# Patient Record
Sex: Male | Born: 1960
Health system: Southern US, Community
[De-identification: ages and names within clinical notes are randomized; demographics above are authoritative.]

---

## 2001-06-16 ENCOUNTER — Inpatient Hospital Stay (HOSPITAL_COMMUNITY): Admission: EM | Admit: 2001-06-16 | Discharge: 2001-06-17 | Payer: Self-pay | Admitting: Emergency Medicine

## 2001-06-16 ENCOUNTER — Encounter (INDEPENDENT_AMBULATORY_CARE_PROVIDER_SITE_OTHER): Payer: Self-pay | Admitting: Specialist

## 2005-12-20 ENCOUNTER — Encounter: Admission: RE | Admit: 2005-12-20 | Discharge: 2005-12-20 | Payer: Self-pay | Admitting: Internal Medicine

## 2005-12-24 ENCOUNTER — Encounter: Admission: RE | Admit: 2005-12-24 | Discharge: 2005-12-24 | Payer: Self-pay | Admitting: Internal Medicine

## 2007-04-19 ENCOUNTER — Encounter: Admission: RE | Admit: 2007-04-19 | Discharge: 2007-04-19 | Payer: Self-pay | Admitting: Family Medicine

## 2008-08-04 ENCOUNTER — Emergency Department (HOSPITAL_COMMUNITY): Admission: EM | Admit: 2008-08-04 | Discharge: 2008-08-04 | Payer: Self-pay | Admitting: Emergency Medicine

## 2010-05-01 ENCOUNTER — Encounter: Admission: RE | Admit: 2010-05-01 | Discharge: 2010-05-01 | Payer: Self-pay | Admitting: Family Medicine

## 2010-10-18 ENCOUNTER — Other Ambulatory Visit: Payer: Self-pay | Admitting: Physician Assistant

## 2010-12-23 NOTE — Op Note (Signed)
[facility]  Patient:    Eric Herrera, Eric Herrera Visit Number: [id number] MRN: [id number]          Service Type: SUR Location: 4W 0481 01 Attending Physician:  Shelly Rubenstein Dictated by:   Abigail Miyamoto, M.D. Proc. Date: 06/16/01 Admit Date:  06/16/2001 Discharge Date: 06/17/2001                             Operative Report  PREOPERATIVE DIAGNOSIS:  Acute appendicitis.  POSTOPERATIVE DIAGNOSIS:  Acute appendicitis.  OPERATION/PROCEDURE:  Open appendectomy.  SURGEON:  Abigail Miyamoto, M.D.  ANESTHESIA:  General endotracheal anesthesia and 1/2% Marcaine.  ESTIMATED BLOOD LOSS:  Minimal.  PROCEDURE IN DETAIL:  The patient was brought to the operating room and identified as [employer].  He was placed supine on the operating room table and general anesthesia was induced.  His abdomen was then prepped and draped in the usual sterile fashion.  A small transverse incision was then made in the patients right lower quadrant.  The incision was carried down to the external oblique fascia which was then opened with the scalpel and then further with the Metzenbaum scissors.  The muscles were then retracted bluntly to identify the peritoneum which was then again opened with Metzenbaum scissors.  The peritoneum was then completely opened.  Upon entering the abdomen, no free fluid was identified.  The appendix was easily identified and elevated up out of the wound and found to be acutely inflamed.  The mesoappendix was taken down with Kelly clamps and 2-0 Vicryl tie.  The base of the appendix was then identified and was crushed with a hemostat. The stump was then tied off with two separate 2-0 Vicryl sutures.  The appendix was then transected.  The stump was then cauterized with electrocautery.  The appendix was removed and sent to pathology.  The abdomen was then copiously irrigated with normal saline.  The peritoneum and posterior layer was then  closed with a running 2-0 Vicryl suture.  The anterior fascia was then closed with a running #1 PDS suture.  The wound was then again irrigated.  The subcutaneous layer was then closed with interrupted 3-0 Vicryl sutures. The skin was closed with a running 4-0 Monocryl and Steri-Strips.  Gauze and tape were then applied.  The patient tolerated the procedure well.  All sponge, needle and instrument counts were correct at the end of the procedure.  The patient was then extubated in the operating room and taken stable to the recovery room. Dictated by:   Abigail Miyamoto, M.D. Attending Physician:  Shelly Rubenstein DD:  06/16/01 TD:  06/17/01 Job: [id number]

## 2010-12-23 NOTE — Op Note (Signed)
[facility]  Patient:    Eric Herrera, Eric Herrera Visit Number: [id number] MRN: [id number]          Service Type: SUR Location: 4W 0481 01 Attending Physician:  Shelly Rubenstein Dictated by:   Abigail Miyamoto, M.D. Proc. Date: 06/16/01 Admit Date:  06/16/2001 Discharge Date: 06/17/2001                             Operative Report  PREOPERATIVE DIAGNOSIS:  Acute appendicitis.  POSTOPERATIVE DIAGNOSIS:  Acute appendicitis.  PROCEDURE:  Open appendectomy.  SURGEON:  Abigail Miyamoto, M.D.  ANESTHESIA:  General endotracheal anesthesia and 0.50% Marcaine.  ESTIMATED BLOOD LOSS: Minimal.  PROCEDURE IN DETAIL: The patient was brought to the operating room and identified as Eric Herrera. He was placed supine on the operating room table, and general anesthesia was induced.  His abdomen was then prepped and draped in the usual sterile fashion.  A small transverse incision was then made in the patients right lower quadrant.  The incision was carried down to the external oblique fascia, which was then opened with the scalpel and then further with the Metzenbaum scissors.  The muscles were then retracted bluntly to identify the peritoneum, which was then opened again with Metzenbaum scissors.  The peritoneum was then completely opened.  Upon entering the abdomen, no free fluid was identified.  The appendix was easily identified and elevated up out of the wound and found to be acutely inflamed.  The mesoappendix was taken down with Kelly clamps and 2-0 Vicryl ties.  The base of the appendix was then identified and was crushed with a hemostat.  The stump was then tied off with two separate 2-0 Vicryl sutures.  The appendix was then transected.  The stump was then cauterized with electrocautery.  The appendix was removed from the field and sent to pathology.  The abdomen was then copiously irrigated with normal saline.  The peritoneum and the posterior layer were  then closed with a running 2-0 Vicryl suture.  The anterior fascia was then closed with a running #1 PDS suture.  The wound was then irrigated. The subcutaneous layer was then closed with interrupted 3-0 Vicryl suture, and the skin was closed with a running 4-0 Monocryl and Steri-Strips.  Gauze and tape were then applied.  The patient tolerated the procedure well.  All sponge, needle, and instrument counts were correct at the end of the procedure.  The patient was then extubated in the operating room and taken in a stable condition to the recovery room. Dictated by:   Abigail Miyamoto, M.D. Attending Physician:  Shelly Rubenstein DD:  06/16/01 TD:  06/17/01 Job: [id number]

## 2011-05-11 LAB — CBC
MCHC: 34 g/dL (ref 30.0–36.0)
MCV: 92.5 fL (ref 78.0–100.0)
RBC: 4.55 MIL/uL (ref 4.22–5.81)

## 2011-05-11 LAB — URINE CULTURE
Colony Count: NO GROWTH
Culture: NO GROWTH

## 2011-05-11 LAB — CULTURE, BLOOD (ROUTINE X 2): Culture: NO GROWTH

## 2011-05-11 LAB — URINALYSIS, ROUTINE W REFLEX MICROSCOPIC
Bilirubin Urine: NEGATIVE
Glucose, UA: NEGATIVE mg/dL
Ketones, ur: NEGATIVE mg/dL
Nitrite: NEGATIVE
Protein, ur: NEGATIVE mg/dL
Specific Gravity, Urine: 1.019 (ref 1.005–1.030)

## 2011-05-11 LAB — DIFFERENTIAL
Basophils Absolute: 0 10*3/uL (ref 0.0–0.1)
Monocytes Relative: 12 % (ref 3–12)
Neutro Abs: 5.9 10*3/uL (ref 1.7–7.7)

## 2011-05-11 LAB — COMPREHENSIVE METABOLIC PANEL
ALT: 49 U/L (ref 0–53)
Albumin: 4.1 g/dL (ref 3.5–5.2)
Alkaline Phosphatase: 60 U/L (ref 39–117)
Calcium: 9.3 mg/dL (ref 8.4–10.5)
GFR calc Af Amer: >60 mL/min (ref >60)
Potassium: 4.2 mEq/L (ref 3.5–5.1)

## 2011-10-10 ENCOUNTER — Other Ambulatory Visit: Payer: Self-pay | Admitting: Gastroenterology

## 2013-07-24 ENCOUNTER — Other Ambulatory Visit: Payer: Self-pay | Admitting: Physician Assistant

## 2013-07-24 ENCOUNTER — Ambulatory Visit
Admission: RE | Admit: 2013-07-24 | Discharge: 2013-07-24 | Disposition: A | Discharge status: Home / Self Care | Payer: 59 | Source: Ambulatory Visit | Attending: Physician Assistant | Admitting: Physician Assistant

## 2013-07-24 DIAGNOSIS — R52 Pain, unspecified: Secondary | ICD-10-CM

## 2014-01-30 ENCOUNTER — Other Ambulatory Visit: Payer: Self-pay | Admitting: Family Medicine

## 2014-01-30 ENCOUNTER — Ambulatory Visit
Admission: RE | Admit: 2014-01-30 | Discharge: 2014-01-30 | Disposition: A | Discharge status: Home / Self Care | Payer: 59 | Source: Ambulatory Visit | Attending: Family Medicine | Admitting: Family Medicine

## 2014-01-30 DIAGNOSIS — M79672 Pain in left foot: Secondary | ICD-10-CM

## 2014-07-07 ENCOUNTER — Ambulatory Visit
Admission: RE | Admit: 2014-07-07 | Discharge: 2014-07-07 | Disposition: A | Discharge status: Home / Self Care | Payer: 59 | Source: Ambulatory Visit | Attending: Physician Assistant | Admitting: Physician Assistant

## 2014-07-07 ENCOUNTER — Other Ambulatory Visit: Payer: Self-pay | Admitting: Physician Assistant

## 2014-07-07 DIAGNOSIS — R5381 Other malaise: Secondary | ICD-10-CM

## 2014-07-07 DIAGNOSIS — R5383 Other fatigue: Principal | ICD-10-CM

## 2014-07-07 DIAGNOSIS — R05 Cough: Secondary | ICD-10-CM

## 2014-07-07 DIAGNOSIS — R059 Cough, unspecified: Secondary | ICD-10-CM

## 2015-12-16 DIAGNOSIS — L299 Pruritus, unspecified: Secondary | ICD-10-CM | POA: Diagnosis not present

## 2015-12-18 DIAGNOSIS — H10013 Acute follicular conjunctivitis, bilateral: Secondary | ICD-10-CM | POA: Diagnosis not present

## 2016-03-31 DIAGNOSIS — E7800 Pure hypercholesterolemia, unspecified: Secondary | ICD-10-CM | POA: Diagnosis not present

## 2016-03-31 DIAGNOSIS — Z125 Encounter for screening for malignant neoplasm of prostate: Secondary | ICD-10-CM | POA: Diagnosis not present

## 2016-03-31 DIAGNOSIS — J309 Allergic rhinitis, unspecified: Secondary | ICD-10-CM | POA: Diagnosis not present

## 2016-03-31 DIAGNOSIS — I10 Essential (primary) hypertension: Secondary | ICD-10-CM | POA: Diagnosis not present

## 2016-03-31 DIAGNOSIS — Z23 Encounter for immunization: Secondary | ICD-10-CM | POA: Diagnosis not present

## 2016-03-31 DIAGNOSIS — Z0000 Encounter for general adult medical examination without abnormal findings: Secondary | ICD-10-CM | POA: Diagnosis not present

## 2016-04-13 DIAGNOSIS — J309 Allergic rhinitis, unspecified: Secondary | ICD-10-CM | POA: Diagnosis not present

## 2016-04-13 DIAGNOSIS — H6690 Otitis media, unspecified, unspecified ear: Secondary | ICD-10-CM | POA: Diagnosis not present

## 2016-04-13 DIAGNOSIS — J45909 Unspecified asthma, uncomplicated: Secondary | ICD-10-CM | POA: Diagnosis not present

## 2016-06-23 DIAGNOSIS — S00412A Abrasion of left ear, initial encounter: Secondary | ICD-10-CM | POA: Diagnosis not present

## 2016-09-14 DIAGNOSIS — H3589 Other specified retinal disorders: Secondary | ICD-10-CM | POA: Diagnosis not present

## 2016-09-14 DIAGNOSIS — H25013 Cortical age-related cataract, bilateral: Secondary | ICD-10-CM | POA: Diagnosis not present

## 2016-09-14 DIAGNOSIS — H40013 Open angle with borderline findings, low risk, bilateral: Secondary | ICD-10-CM | POA: Diagnosis not present

## 2016-09-14 DIAGNOSIS — H2513 Age-related nuclear cataract, bilateral: Secondary | ICD-10-CM | POA: Diagnosis not present

## 2016-10-04 DIAGNOSIS — J309 Allergic rhinitis, unspecified: Secondary | ICD-10-CM | POA: Diagnosis not present

## 2016-10-04 DIAGNOSIS — J45909 Unspecified asthma, uncomplicated: Secondary | ICD-10-CM | POA: Diagnosis not present

## 2016-11-17 DIAGNOSIS — H40001 Preglaucoma, unspecified, right eye: Secondary | ICD-10-CM | POA: Diagnosis not present

## 2016-11-17 DIAGNOSIS — H40002 Preglaucoma, unspecified, left eye: Secondary | ICD-10-CM | POA: Diagnosis not present

## 2016-11-17 DIAGNOSIS — H02839 Dermatochalasis of unspecified eye, unspecified eyelid: Secondary | ICD-10-CM | POA: Diagnosis not present

## 2016-11-17 DIAGNOSIS — H40003 Preglaucoma, unspecified, bilateral: Secondary | ICD-10-CM | POA: Diagnosis not present

## 2016-11-17 DIAGNOSIS — H25013 Cortical age-related cataract, bilateral: Secondary | ICD-10-CM | POA: Diagnosis not present

## 2016-11-17 DIAGNOSIS — H2513 Age-related nuclear cataract, bilateral: Secondary | ICD-10-CM | POA: Diagnosis not present

## 2017-02-05 DIAGNOSIS — R490 Dysphonia: Secondary | ICD-10-CM | POA: Diagnosis not present

## 2017-03-07 DIAGNOSIS — R05 Cough: Secondary | ICD-10-CM | POA: Diagnosis not present

## 2017-03-07 DIAGNOSIS — R490 Dysphonia: Secondary | ICD-10-CM | POA: Diagnosis not present

## 2017-03-07 DIAGNOSIS — J383 Other diseases of vocal cords: Secondary | ICD-10-CM | POA: Diagnosis not present

## 2017-04-13 DIAGNOSIS — K219 Gastro-esophageal reflux disease without esophagitis: Secondary | ICD-10-CM | POA: Diagnosis not present

## 2017-04-13 DIAGNOSIS — R490 Dysphonia: Secondary | ICD-10-CM | POA: Diagnosis not present

## 2017-04-18 DIAGNOSIS — Z131 Encounter for screening for diabetes mellitus: Secondary | ICD-10-CM | POA: Diagnosis not present

## 2017-04-18 DIAGNOSIS — Z0000 Encounter for general adult medical examination without abnormal findings: Secondary | ICD-10-CM | POA: Diagnosis not present

## 2017-04-18 DIAGNOSIS — Z125 Encounter for screening for malignant neoplasm of prostate: Secondary | ICD-10-CM | POA: Diagnosis not present

## 2017-04-18 DIAGNOSIS — E7800 Pure hypercholesterolemia, unspecified: Secondary | ICD-10-CM | POA: Diagnosis not present

## 2017-04-18 DIAGNOSIS — Z23 Encounter for immunization: Secondary | ICD-10-CM | POA: Diagnosis not present

## 2017-08-09 DIAGNOSIS — E7800 Pure hypercholesterolemia, unspecified: Secondary | ICD-10-CM | POA: Diagnosis not present

## 2017-09-20 DIAGNOSIS — Z86010 Personal history of colonic polyps: Secondary | ICD-10-CM | POA: Diagnosis not present

## 2017-09-20 DIAGNOSIS — D126 Benign neoplasm of colon, unspecified: Secondary | ICD-10-CM | POA: Diagnosis not present

## 2017-09-20 DIAGNOSIS — K5730 Diverticulosis of large intestine without perforation or abscess without bleeding: Secondary | ICD-10-CM | POA: Diagnosis not present

## 2017-09-25 DIAGNOSIS — D126 Benign neoplasm of colon, unspecified: Secondary | ICD-10-CM | POA: Diagnosis not present

## 2017-11-12 DIAGNOSIS — J309 Allergic rhinitis, unspecified: Secondary | ICD-10-CM | POA: Diagnosis not present

## 2017-11-12 DIAGNOSIS — R0602 Shortness of breath: Secondary | ICD-10-CM | POA: Diagnosis not present

## 2017-11-12 DIAGNOSIS — J45909 Unspecified asthma, uncomplicated: Secondary | ICD-10-CM | POA: Diagnosis not present

## 2017-11-19 DIAGNOSIS — J069 Acute upper respiratory infection, unspecified: Secondary | ICD-10-CM | POA: Diagnosis not present

## 2017-11-19 DIAGNOSIS — R490 Dysphonia: Secondary | ICD-10-CM | POA: Diagnosis not present

## 2018-01-31 DIAGNOSIS — H40013 Open angle with borderline findings, low risk, bilateral: Secondary | ICD-10-CM | POA: Diagnosis not present

## 2018-01-31 DIAGNOSIS — H04123 Dry eye syndrome of bilateral lacrimal glands: Secondary | ICD-10-CM | POA: Diagnosis not present

## 2018-01-31 DIAGNOSIS — H0100B Unspecified blepharitis left eye, upper and lower eyelids: Secondary | ICD-10-CM | POA: Diagnosis not present

## 2018-01-31 DIAGNOSIS — H0100A Unspecified blepharitis right eye, upper and lower eyelids: Secondary | ICD-10-CM | POA: Diagnosis not present

## 2018-04-19 DIAGNOSIS — E7800 Pure hypercholesterolemia, unspecified: Secondary | ICD-10-CM | POA: Diagnosis not present

## 2018-04-19 DIAGNOSIS — Z23 Encounter for immunization: Secondary | ICD-10-CM | POA: Diagnosis not present

## 2018-04-19 DIAGNOSIS — Z0000 Encounter for general adult medical examination without abnormal findings: Secondary | ICD-10-CM | POA: Diagnosis not present

## 2018-04-19 DIAGNOSIS — Z125 Encounter for screening for malignant neoplasm of prostate: Secondary | ICD-10-CM | POA: Diagnosis not present

## 2018-07-11 DIAGNOSIS — H9203 Otalgia, bilateral: Secondary | ICD-10-CM | POA: Diagnosis not present

## 2018-07-11 DIAGNOSIS — R0981 Nasal congestion: Secondary | ICD-10-CM | POA: Diagnosis not present

## 2018-08-29 DIAGNOSIS — H9313 Tinnitus, bilateral: Secondary | ICD-10-CM | POA: Diagnosis not present

## 2018-08-29 DIAGNOSIS — H903 Sensorineural hearing loss, bilateral: Secondary | ICD-10-CM | POA: Diagnosis not present

## 2018-08-29 DIAGNOSIS — R42 Dizziness and giddiness: Secondary | ICD-10-CM | POA: Diagnosis not present

## 2018-09-02 ENCOUNTER — Other Ambulatory Visit: Payer: Self-pay | Admitting: Physician Assistant

## 2018-09-02 DIAGNOSIS — R42 Dizziness and giddiness: Secondary | ICD-10-CM | POA: Diagnosis not present

## 2018-09-02 DIAGNOSIS — H9319 Tinnitus, unspecified ear: Secondary | ICD-10-CM | POA: Diagnosis not present

## 2018-09-08 ENCOUNTER — Ambulatory Visit
Admission: RE | Admit: 2018-09-08 | Discharge: 2018-09-08 | Disposition: A | Discharge status: Home / Self Care | Payer: Self-pay | Source: Ambulatory Visit | Attending: Physician Assistant | Admitting: Physician Assistant

## 2018-09-08 DIAGNOSIS — R42 Dizziness and giddiness: Secondary | ICD-10-CM

## 2018-09-08 DIAGNOSIS — H9209 Otalgia, unspecified ear: Secondary | ICD-10-CM | POA: Diagnosis not present

## 2018-09-08 MED ORDER — GADOBENATE DIMEGLUMINE 529 MG/ML IV SOLN
20.0000 mL | Freq: Once | INTRAVENOUS | Status: AC | PRN
  Administered 2018-09-08: 20 mL via INTRAVENOUS

## 2018-12-16 DIAGNOSIS — R062 Wheezing: Secondary | ICD-10-CM | POA: Diagnosis not present

## 2018-12-16 DIAGNOSIS — R0602 Shortness of breath: Secondary | ICD-10-CM | POA: Diagnosis not present

## 2018-12-16 DIAGNOSIS — J45909 Unspecified asthma, uncomplicated: Secondary | ICD-10-CM | POA: Diagnosis not present

## 2018-12-16 DIAGNOSIS — J309 Allergic rhinitis, unspecified: Secondary | ICD-10-CM | POA: Diagnosis not present

## 2018-12-17 DIAGNOSIS — J45909 Unspecified asthma, uncomplicated: Secondary | ICD-10-CM | POA: Diagnosis not present

## 2019-01-17 DIAGNOSIS — J45909 Unspecified asthma, uncomplicated: Secondary | ICD-10-CM | POA: Diagnosis not present

## 2019-02-16 DIAGNOSIS — J45909 Unspecified asthma, uncomplicated: Secondary | ICD-10-CM | POA: Diagnosis not present

## 2019-03-19 DIAGNOSIS — J45909 Unspecified asthma, uncomplicated: Secondary | ICD-10-CM | POA: Diagnosis not present

## 2019-03-20 DIAGNOSIS — H04123 Dry eye syndrome of bilateral lacrimal glands: Secondary | ICD-10-CM | POA: Diagnosis not present

## 2019-03-20 DIAGNOSIS — H0102A Squamous blepharitis right eye, upper and lower eyelids: Secondary | ICD-10-CM | POA: Diagnosis not present

## 2019-03-20 DIAGNOSIS — H40013 Open angle with borderline findings, low risk, bilateral: Secondary | ICD-10-CM | POA: Diagnosis not present

## 2019-03-20 DIAGNOSIS — H0102B Squamous blepharitis left eye, upper and lower eyelids: Secondary | ICD-10-CM | POA: Diagnosis not present

## 2019-04-02 DIAGNOSIS — R21 Rash and other nonspecific skin eruption: Secondary | ICD-10-CM | POA: Diagnosis not present

## 2019-04-19 DIAGNOSIS — J45909 Unspecified asthma, uncomplicated: Secondary | ICD-10-CM | POA: Diagnosis not present

## 2019-04-29 DIAGNOSIS — Z23 Encounter for immunization: Secondary | ICD-10-CM | POA: Diagnosis not present

## 2019-04-29 DIAGNOSIS — Z125 Encounter for screening for malignant neoplasm of prostate: Secondary | ICD-10-CM | POA: Diagnosis not present

## 2019-04-29 DIAGNOSIS — J309 Allergic rhinitis, unspecified: Secondary | ICD-10-CM | POA: Diagnosis not present

## 2019-04-29 DIAGNOSIS — E7800 Pure hypercholesterolemia, unspecified: Secondary | ICD-10-CM | POA: Diagnosis not present

## 2019-04-29 DIAGNOSIS — J45909 Unspecified asthma, uncomplicated: Secondary | ICD-10-CM | POA: Diagnosis not present

## 2019-04-29 DIAGNOSIS — Z0000 Encounter for general adult medical examination without abnormal findings: Secondary | ICD-10-CM | POA: Diagnosis not present

## 2019-05-19 DIAGNOSIS — J45909 Unspecified asthma, uncomplicated: Secondary | ICD-10-CM | POA: Diagnosis not present

## 2019-06-19 DIAGNOSIS — J45909 Unspecified asthma, uncomplicated: Secondary | ICD-10-CM | POA: Diagnosis not present

## 2019-07-11 DIAGNOSIS — Z20828 Contact with and (suspected) exposure to other viral communicable diseases: Secondary | ICD-10-CM | POA: Diagnosis not present

## 2019-07-11 DIAGNOSIS — I10 Essential (primary) hypertension: Secondary | ICD-10-CM | POA: Diagnosis not present

## 2019-07-19 DIAGNOSIS — J45909 Unspecified asthma, uncomplicated: Secondary | ICD-10-CM | POA: Diagnosis not present

## 2019-08-19 DIAGNOSIS — J45909 Unspecified asthma, uncomplicated: Secondary | ICD-10-CM | POA: Diagnosis not present

## 2019-09-03 ENCOUNTER — Ambulatory Visit: Payer: BC Managed Care – PPO | Attending: Internal Medicine

## 2019-09-03 DIAGNOSIS — Z20822 Contact with and (suspected) exposure to covid-19: Secondary | ICD-10-CM

## 2019-09-04 LAB — NOVEL CORONAVIRUS, NAA: SARS-CoV-2, NAA: NOT DETECTED

## 2019-09-05 DIAGNOSIS — S0501XA Injury of conjunctiva and corneal abrasion without foreign body, right eye, initial encounter: Secondary | ICD-10-CM | POA: Diagnosis not present

## 2019-09-05 DIAGNOSIS — H0102A Squamous blepharitis right eye, upper and lower eyelids: Secondary | ICD-10-CM | POA: Diagnosis not present

## 2019-09-05 DIAGNOSIS — H18593 Other hereditary corneal dystrophies, bilateral: Secondary | ICD-10-CM | POA: Diagnosis not present

## 2019-09-05 DIAGNOSIS — S0502XA Injury of conjunctiva and corneal abrasion without foreign body, left eye, initial encounter: Secondary | ICD-10-CM | POA: Diagnosis not present

## 2019-09-19 DIAGNOSIS — J45909 Unspecified asthma, uncomplicated: Secondary | ICD-10-CM | POA: Diagnosis not present

## 2019-09-23 ENCOUNTER — Ambulatory Visit: Payer: BC Managed Care – PPO | Attending: Internal Medicine

## 2019-09-23 DIAGNOSIS — Z20822 Contact with and (suspected) exposure to covid-19: Secondary | ICD-10-CM

## 2019-09-24 LAB — NOVEL CORONAVIRUS, NAA: SARS-CoV-2, NAA: NOT DETECTED

## 2019-10-17 DIAGNOSIS — J45909 Unspecified asthma, uncomplicated: Secondary | ICD-10-CM | POA: Diagnosis not present

## 2019-10-28 DIAGNOSIS — E7800 Pure hypercholesterolemia, unspecified: Secondary | ICD-10-CM | POA: Diagnosis not present

## 2019-10-28 DIAGNOSIS — I10 Essential (primary) hypertension: Secondary | ICD-10-CM | POA: Diagnosis not present

## 2019-10-28 DIAGNOSIS — J309 Allergic rhinitis, unspecified: Secondary | ICD-10-CM | POA: Diagnosis not present

## 2019-10-28 DIAGNOSIS — J45909 Unspecified asthma, uncomplicated: Secondary | ICD-10-CM | POA: Diagnosis not present

## 2019-11-17 DIAGNOSIS — J45909 Unspecified asthma, uncomplicated: Secondary | ICD-10-CM | POA: Diagnosis not present

## 2019-12-17 DIAGNOSIS — J45909 Unspecified asthma, uncomplicated: Secondary | ICD-10-CM | POA: Diagnosis not present

## 2019-12-23 DIAGNOSIS — J3489 Other specified disorders of nose and nasal sinuses: Secondary | ICD-10-CM | POA: Diagnosis not present

## 2019-12-26 DIAGNOSIS — J340 Abscess, furuncle and carbuncle of nose: Secondary | ICD-10-CM | POA: Diagnosis not present

## 2019-12-26 DIAGNOSIS — L0291 Cutaneous abscess, unspecified: Secondary | ICD-10-CM | POA: Diagnosis not present

## 2020-01-08 DIAGNOSIS — J340 Abscess, furuncle and carbuncle of nose: Secondary | ICD-10-CM | POA: Diagnosis not present

## 2020-01-17 DIAGNOSIS — J45909 Unspecified asthma, uncomplicated: Secondary | ICD-10-CM | POA: Diagnosis not present

## 2020-02-16 DIAGNOSIS — J45909 Unspecified asthma, uncomplicated: Secondary | ICD-10-CM | POA: Diagnosis not present

## 2020-03-09 DIAGNOSIS — Z20822 Contact with and (suspected) exposure to covid-19: Secondary | ICD-10-CM | POA: Diagnosis not present

## 2020-03-18 DIAGNOSIS — J45909 Unspecified asthma, uncomplicated: Secondary | ICD-10-CM | POA: Diagnosis not present

## 2020-03-23 DIAGNOSIS — H40013 Open angle with borderline findings, low risk, bilateral: Secondary | ICD-10-CM | POA: Diagnosis not present

## 2020-03-23 DIAGNOSIS — H0102B Squamous blepharitis left eye, upper and lower eyelids: Secondary | ICD-10-CM | POA: Diagnosis not present

## 2020-03-23 DIAGNOSIS — H0102A Squamous blepharitis right eye, upper and lower eyelids: Secondary | ICD-10-CM | POA: Diagnosis not present

## 2020-03-23 DIAGNOSIS — H04123 Dry eye syndrome of bilateral lacrimal glands: Secondary | ICD-10-CM | POA: Diagnosis not present

## 2020-03-23 DIAGNOSIS — H52223 Regular astigmatism, bilateral: Secondary | ICD-10-CM | POA: Diagnosis not present

## 2020-04-18 DIAGNOSIS — J45909 Unspecified asthma, uncomplicated: Secondary | ICD-10-CM | POA: Diagnosis not present

## 2020-05-10 ENCOUNTER — Other Ambulatory Visit: Payer: Self-pay | Admitting: Physician Assistant

## 2020-05-10 ENCOUNTER — Ambulatory Visit
Admission: RE | Admit: 2020-05-10 | Discharge: 2020-05-10 | Disposition: A | Discharge status: Home / Self Care | Payer: BC Managed Care – PPO | Source: Ambulatory Visit | Attending: Physician Assistant | Admitting: Physician Assistant

## 2020-05-10 DIAGNOSIS — Z23 Encounter for immunization: Secondary | ICD-10-CM | POA: Diagnosis not present

## 2020-05-10 DIAGNOSIS — Z125 Encounter for screening for malignant neoplasm of prostate: Secondary | ICD-10-CM | POA: Diagnosis not present

## 2020-05-10 DIAGNOSIS — K219 Gastro-esophageal reflux disease without esophagitis: Secondary | ICD-10-CM | POA: Diagnosis not present

## 2020-05-10 DIAGNOSIS — R0602 Shortness of breath: Secondary | ICD-10-CM

## 2020-05-10 DIAGNOSIS — Z0000 Encounter for general adult medical examination without abnormal findings: Secondary | ICD-10-CM | POA: Diagnosis not present

## 2020-05-10 DIAGNOSIS — I10 Essential (primary) hypertension: Secondary | ICD-10-CM | POA: Diagnosis not present

## 2020-05-26 ENCOUNTER — Other Ambulatory Visit: Payer: Self-pay

## 2020-05-26 ENCOUNTER — Ambulatory Visit: Payer: BC Managed Care – PPO | Admitting: Internal Medicine

## 2020-05-26 ENCOUNTER — Encounter: Payer: Self-pay | Admitting: Internal Medicine

## 2020-05-26 VITALS — BP 142/92 | HR 65 | Ht 72.0 in | Wt 220.6 lb

## 2020-05-26 DIAGNOSIS — E7849 Other hyperlipidemia: Secondary | ICD-10-CM

## 2020-05-26 DIAGNOSIS — R0600 Dyspnea, unspecified: Secondary | ICD-10-CM | POA: Diagnosis not present

## 2020-05-26 DIAGNOSIS — R0609 Other forms of dyspnea: Secondary | ICD-10-CM | POA: Insufficient documentation

## 2020-05-26 DIAGNOSIS — I10 Essential (primary) hypertension: Secondary | ICD-10-CM | POA: Diagnosis not present

## 2020-05-26 MED ORDER — METOPROLOL TARTRATE 50 MG PO TABS: ORAL_TABLET | ORAL | 0 refills | Status: DC

## 2020-05-26 NOTE — Patient Instructions (Signed)
Medication Instructions:  *If you need a refill on your cardiac medications before your next appointment, please call your pharmacy*  Testing/Procedures: Your physicians recommends that you have a Cardiac CTA, a special type of CT scan that uses a computer to produce multi-dimensional views of major blood vessels throughout the body. In CT angiography, a contrast material is injected through an IV to help visualize the blood vessels -- If your testing is scheduled after Jun 11 2020 you will need to have a repeat BMET prior to your Cardiac CTA.   Follow-Up: At [facility], you and your health needs are our priority.  As part of our continuing mission to provide you with exceptional heart care, we have created designated Provider Care Teams.  These Care Teams include your primary Cardiologist (physician) and Advanced Practice Providers (APPs -  Physician Assistants and Nurse Practitioners) who all work together to provide you with the care you need, when you need it.  We recommend signing up for the patient portal called "MyChart".  Sign up information is provided on this After Visit Summary.  MyChart is used to connect with patients for Virtual Visits (Telemedicine).  Patients are able to view lab/test results, encounter notes, upcoming appointments, etc.  Non-urgent messages can be sent to your provider as well.   To learn more about what you can do with MyChart, go to [email or web address].    Your next appointment:   Your physician recommends that you schedule a follow-up appointment in: 4-5 MONTHS with Dr. Gasper Sells.  The format for your next appointment:   In Person with Rudean Haskell, MD   Your cardiac CT will be scheduled at one of the below locations:   [facility] [address] [place], [place] [address] [phone]  -- Your testing will take place at [facility] -- Please arrive at the [facility] main entrance of [facility] 30  minutes prior to test start time. -- Proceed to the [facility] Radiology Department (first floor) to check-in and test prep.  On the Night Before the Test: . Be sure to Drink plenty of water. . Do not consume any caffeinated/decaffeinated beverages or chocolate 12 hours prior to your test. . Do not take any antihistamines 12 hours prior to your test.  On the Day of the Test: . Drink plenty of water. Do not drink any water within one hour of the test. . Do not eat any food 4 hours prior to the test. . You may take your regular medications prior to the test.  . Take metoprolol 50 mg (Lopressor) two hours prior to test. -- RX SENT TO PHARMACY     After the Test: . Drink plenty of water. . After receiving IV contrast, you may experience a mild flushed feeling. This is normal. . On occasion, you may experience a mild rash up to 24 hours after the test. This is not dangerous. If this occurs, you can take Benadryl 25 mg and increase your fluid intake. . If you experience trouble breathing, this can be serious. If it is severe call 911 IMMEDIATELY. If it is mild, please call our office.  Once we have confirmed authorization from your insurance company, we will call you to set up a date and time for your test. Based on how quickly your insurance processes prior authorizations requests, please allow up to 4 weeks to be contacted for scheduling your Cardiac CT appointment. Be advised that routine Cardiac CT appointments could be scheduled  as many as 8 weeks after your provider has ordered it.  For non-scheduling related questions, please contact the cardiac imaging nurse navigator should you have any questions/concerns: Marchia Bond, Cardiac Imaging Nurse Navigator Burley Saver, Interim Cardiac Imaging Nurse [facility] Direct Office Dial: [phone]   For scheduling needs, including cancellations and rescheduling, please call Vivien Rota at [phone], option 3.

## 2020-05-26 NOTE — Progress Notes (Signed)
Cardiology Office Note:    Date:  05/26/2020   ID:  Eric Herrera, Eric Herrera 1960/09/13, MRN [id number]  PCP:  Milus Height, PA   Referring MD: Milus Height, [place]  CC: DOE Consulted for the evaluation of shortness of breath at the [place] [facility] [place], [place], [place]  History of Present Illness:    Eric Herrera is a 59 y.o. male with a hx of HTN (with lisinopril associated cough), HLD with Statin intolerance on Nexletol, Asthma who presents with shortness of breath.  Patient notes that he gets short winded with exercise.  Feels as if he gets leg weakness and cant catch his breath.  Has stopped running.  Shopping overhead of cutting  causes shortness of breath.  Patient notes that he recovers from his shortness of breath with Gatorade.  No weight gain no PND, orthopnea, no resting SOB.  Never Chest pain.  No syncope or near syncope.  History reviewed. No pertinent past medical history.  History reviewed. No pertinent surgical history.  Current Medications: Current Meds  Medication Sig  . Bempedoic Acid (NEXLETOL) 180 MG TABS Take 1 tablet by mouth daily.   . cetirizine (ZYRTEC) 10 MG tablet Take 1 tablet by mouth daily.  Marland Kitchen losartan (COZAAR) 25 MG tablet Take 1 tablet by mouth daily.  . montelukast (SINGULAIR) 10 MG tablet Take 1 tablet by mouth daily.  . pantoprazole (PROTONIX) 40 MG tablet Take 40 mg by mouth every other day.     Allergies:   Meperidine, Other, Cephalexin, Atorvastatin, Ezetimibe, Lisinopril, Prednisone, and Rosuvastatin   Social History   Socioeconomic History  . Marital status: Married    Spouse name: Not on file  . Number of children: Not on file  . Years of education: Not on file  . Highest education level: Not on file  Occupational History  . Not on file  Tobacco Use  . Smoking status: Never Smoker  . Smokeless tobacco: Never Used  Vaping Use  . Vaping Use: Never used  Substance and Sexual Activity  . Alcohol use: Yes    Comment: occasional  .  Drug use: Never  . Sexual activity: Yes  Other Topics Concern  . Not on file  Social History Narrative  . Not on file   Social Determinants of Health   Financial Resource Strain:   . Difficulty of Paying Living Expenses: Not on file  Food Insecurity:   . Worried About Programme researcher, broadcasting/film/video in the Last Year: Not on file  . Ran Out of Food in the Last Year: Not on file  Transportation Needs:   . Lack of Transportation (Medical): Not on file  . Lack of Transportation (Non-Medical): Not on file  Physical Activity:   . Days of Exercise per Week: Not on file  . Minutes of Exercise per Session: Not on file  Stress:   . Feeling of Stress : Not on file  Social Connections:   . Frequency of Communication with Friends and Family: Not on file  . Frequency of Social Gatherings with Friends and Family: Not on file  . Attends Religious Services: Not on file  . Active Member of Clubs or Organizations: Not on file  . Attends Banker Meetings: Not on file  . Marital Status: Not on file     Family History: The patient's family history includes COPD in his father; Dementia in his mother; Emphysema in his father; Hypertension in his brother and father; Hypotension in his mother.  Uncle had MI's at around 60 Grandfather had MI in 12s  ROS:   Please see the history of present illness.    All other systems reviewed and are negative.  EKGs/Labs/Other Studies Reviewed:    The following studies were reviewed today:  EKG:   EKG is  ordered today.  The ekg ordered today demonstrates Sinsu bradycardia rate 51 without St/T changes or q waves OSH 02/25/14 sinus rhythm rate 62, without ST/T changes or Q waves.  05/11/20 Labs Creatinine 1.10 AST/ALT 24/42  10/2019 pre-therapy LDL 181 on no medications  Physical Exam:    VS:  BP (!) 142/92   Pulse 65   Ht 6' (1.829 m)   Wt 220 lb 9.6 oz (100.1 kg)   SpO2 97%   BMI 29.92 kg/m     Wt Readings from Last 3 Encounters:  05/26/20 220  lb 9.6 oz (100.1 kg)    GEN: Well nourished, well developed in no acute distress HEENT: Normal NECK: No JVD; No carotid bruits LYMPHATICS: No lymphadenopathy CARDIAC: RRR, no murmurs, rubs, gallops RESPIRATORY:  Clear to auscultation without rales, wheezing or rhonchi  ABDOMEN: Soft, non-tender, non-distended MUSCULOSKELETAL:  No edema; No deformity  SKIN: Warm and dry NEUROLOGIC:  Alert and oriented x 3 PSYCHIATRIC:  Normal affect   ASSESSMENT:    1. DOE (dyspnea on exertion)   2. Other hyperlipidemia   3. Essential hypertension    PLAN:    In order of problems listed above:  Dyspnea on Exertion HLD HTN - The patient presents with atypical but DOE .  - EKG shows sinus bradycardia - CVD risk factors include HLD; continue bempedoic acid - Would recommend CCTA /FFr to exclude obstructive CAD  - recommend blood pressure monitoring, with goal of 135/85 - little room for BB - if positive CCTA; we have risks and benefits of cardiac catheterization have been discussed with the patient.  These include bleeding, infection, kidney damage, stroke, heart attack, death.  The patient understands these risks and is willing to proceed.  See in 4-5 months follow up unless new symptoms or abnormal test results warranting change in plan  Would be reasonable for  Virtual Follow up Would be reasonable for  APP Follow up  Medication Adjustments/Labs and Tests Ordered: Current medicines are reviewed at length with the patient today.  Concerns regarding medicines are outlined above.  No orders of the defined types were placed in this encounter.  No orders of the defined types were placed in this encounter.   There are no Patient Instructions on file for this visit.   Signed, Christell Constant, MD  05/26/2020 10:36 AM    [facility]

## 2020-05-28 NOTE — Addendum Note (Signed)
Addended by: Dareen Piano on: 05/28/2020 08:07 AM   Modules accepted: Orders

## 2020-06-17 ENCOUNTER — Telehealth (HOSPITAL_COMMUNITY): Payer: Self-pay | Admitting: Emergency Medicine

## 2020-06-17 NOTE — Telephone Encounter (Signed)
Reaching out to patient to offer assistance regarding upcoming cardiac imaging study; pt verbalizes understanding of appt date/time, parking situation and where to check in, pre-test NPO status and medications ordered, and verified current allergies; name and call back number provided for further questions should they arise Rockwell Alexandria RN Navigator Cardiac Imaging [facility] [phone] office [phone] cell  Pt holding antihistamines, taking 50mg  metoprolol tartrate 2 hr prior to scan

## 2020-06-18 ENCOUNTER — Encounter: Payer: Self-pay | Admitting: *Deleted

## 2020-06-18 ENCOUNTER — Ambulatory Visit (HOSPITAL_COMMUNITY)
Admission: RE | Admit: 2020-06-18 | Discharge: 2020-06-18 | Disposition: A | Discharge status: Home / Self Care | Payer: BC Managed Care – PPO | Source: Ambulatory Visit | Attending: Internal Medicine | Admitting: Internal Medicine

## 2020-06-18 ENCOUNTER — Other Ambulatory Visit: Payer: Self-pay

## 2020-06-18 DIAGNOSIS — R0600 Dyspnea, unspecified: Secondary | ICD-10-CM | POA: Insufficient documentation

## 2020-06-18 DIAGNOSIS — Z006 Encounter for examination for normal comparison and control in clinical research program: Secondary | ICD-10-CM

## 2020-06-18 DIAGNOSIS — R0609 Other forms of dyspnea: Secondary | ICD-10-CM

## 2020-06-18 DIAGNOSIS — I700 Atherosclerosis of aorta: Secondary | ICD-10-CM | POA: Diagnosis not present

## 2020-06-18 MED ORDER — IOHEXOL 350 MG/ML SOLN
80.0000 mL | Freq: Once | INTRAVENOUS | Status: AC | PRN
  Administered 2020-06-18: 80 mL via INTRAVENOUS

## 2020-06-18 MED ORDER — NITROGLYCERIN 0.4 MG SL SUBL: 0.8000 mg | SUBLINGUAL_TABLET | Freq: Once | SUBLINGUAL | Status: AC

## 2020-06-18 MED ORDER — NITROGLYCERIN 0.4 MG SL SUBL
SUBLINGUAL_TABLET | SUBLINGUAL | Status: AC
  Administered 2020-06-18: 0.8 mg via SUBLINGUAL
  Filled 2020-06-18: qty 2

## 2020-06-18 NOTE — Research (Signed)
IDENTIFY Informed Consent                  Subject Name:   Eric Herrera   Subject met inclusion and exclusion criteria.  The informed consent form, study requirements and expectations were reviewed with the subject and questions and concerns were addressed prior to the signing of the consent form.  The subject verbalized understanding of the trial requirements.  The subject agreed to participate in the IDENTIFY trial and signed the informed consent.  The informed consent was obtained prior to performance of any protocol-specific procedures for the subject.  A copy of the signed informed consent was given to the subject and a copy was placed in the subject's medical record.   [place] Katasha Riga, Research Assistant  06/18/2020 07:45 a.m.

## 2020-07-15 DIAGNOSIS — H18593 Other hereditary corneal dystrophies, bilateral: Secondary | ICD-10-CM | POA: Diagnosis not present

## 2020-08-10 DIAGNOSIS — E7800 Pure hypercholesterolemia, unspecified: Secondary | ICD-10-CM | POA: Diagnosis not present

## 2020-09-28 ENCOUNTER — Encounter: Payer: Self-pay | Admitting: Internal Medicine

## 2020-09-28 ENCOUNTER — Other Ambulatory Visit: Payer: Self-pay

## 2020-09-28 ENCOUNTER — Ambulatory Visit: Payer: BC Managed Care – PPO | Admitting: Internal Medicine

## 2020-09-28 VITALS — BP 110/84 | HR 66 | Ht 72.0 in | Wt 225.2 lb

## 2020-09-28 DIAGNOSIS — I10 Essential (primary) hypertension: Secondary | ICD-10-CM

## 2020-09-28 DIAGNOSIS — I2510 Atherosclerotic heart disease of native coronary artery without angina pectoris: Secondary | ICD-10-CM | POA: Diagnosis not present

## 2020-09-28 DIAGNOSIS — I700 Atherosclerosis of aorta: Secondary | ICD-10-CM | POA: Diagnosis not present

## 2020-09-28 DIAGNOSIS — E7849 Other hyperlipidemia: Secondary | ICD-10-CM

## 2020-09-28 DIAGNOSIS — I2584 Coronary atherosclerosis due to calcified coronary lesion: Secondary | ICD-10-CM

## 2020-09-28 NOTE — Progress Notes (Signed)
Cardiology Office Note:    Date:  09/28/2020   ID:  Anterio, Scheel May 02, 1961, MRN [id number]  PCP:  Milus Height, PA   Referring MD: Milus Height, [place]  CC: Follow up CT Scan  History of Present Illness:    Eric Herrera is a 60 y.o. male with a hx of HTN (with lisinopril associated cough), HLD with Statin intolerance on Nexletol, Asthma who presented 05/26/20.  In interim had CCTA that showed Aortic atherosclerosis and coronary artery calcification without obstructive disease.  Patient notes that he is doing good.  Since last visit notes improvement in blood pressure.  Has increase his exercise and walking.  There are no interval hospital/ED visit.    No chest pain or pressure.  Says that he had chemical exposure issues in the past and may not really have asthma. No PND/Orthopnea.  No weight gain or leg swelling. No palpitations or syncope.  Improvement in fatigue- though notes that he when he gets minor infections, it really knocks him on his feet.   History reviewed. No pertinent past medical history.  History reviewed. No pertinent surgical history.  Current Medications: Current Meds  Medication Sig  . albuterol (PROVENTIL) (2.5 MG/3ML) 0.083% nebulizer solution 3 ml as needed  . albuterol (VENTOLIN HFA) 108 (90 Base) MCG/ACT inhaler 1 puff as needed  . Bempedoic Acid (NEXLETOL) 180 MG TABS Take 1 tablet by mouth daily.   . cetirizine (ZYRTEC) 10 MG tablet Take 1 tablet by mouth daily.  . diclofenac (VOLTAREN) 50 MG EC tablet 1 tablet with food  . losartan (COZAAR) 25 MG tablet Take 1 tablet by mouth daily.  . montelukast (SINGULAIR) 10 MG tablet Take 1 tablet by mouth daily.  . pantoprazole (PROTONIX) 40 MG tablet Take 40 mg by mouth every other day.  Marland Kitchen Respiratory Therapy Supplies (NEBULIZER) DEVI See admin instructions.  . RESTASIS 0.05 % ophthalmic emulsion 1 drop 2 (two) times daily.     Allergies:   Meperidine, Other, Cephalexin, Atorvastatin,  Ezetimibe, Lisinopril, Prednisone, and Rosuvastatin   Social History   Socioeconomic History  . Marital status: Married    Spouse name: Not on file  . Number of children: Not on file  . Years of education: Not on file  . Highest education level: Not on file  Occupational History  . Not on file  Tobacco Use  . Smoking status: Never Smoker  . Smokeless tobacco: Never Used  Vaping Use  . Vaping Use: Never used  Substance and Sexual Activity  . Alcohol use: Yes    Comment: occasional  . Drug use: Never  . Sexual activity: Yes  Other Topics Concern  . Not on file  Social History Narrative  . Not on file   Social Determinants of Health   Financial Resource Strain: Not on file  Food Insecurity: Not on file  Transportation Needs: Not on file  Physical Activity: Not on file  Stress: Not on file  Social Connections: Not on file    Social:  Previous exposure to hazardous chemical (inhaled)  Family History: The patient's family history includes COPD in his father; Dementia in his mother; Emphysema in his father; Hypertension in his brother and father; Hypotension in his mother.  Uncle had MI's at around 60 Grandfather had MI in 55s  ROS:   Please see the history of present illness.    All other systems reviewed and are negative.  EKGs/Labs/Other Studies Reviewed:    The following studies were  reviewed today:  EKG:   05/27/20: Sinus bradycardia rate 51 without St/T changes or q waves OSH 02/25/14 sinus rhythm rate 62, without ST/T changes or Q waves.  Cardiac CT: Date: 07/05/2020 Results: IMPRESSION: 1. Mild non-obstructive CAD, CADRADS = 2.  2. Coronary calcium score of 18. This was 53rd percentile for age and sex matched control.  3. Normal coronary origin with right dominance.  4. Aortic atherosclerosis.  5. Further cardiac risk factor modification recommended.   05/11/20 Labs Creatinine 1.10 AST/ALT 24/42   Physical Exam:    VS:  BP 110/84    Pulse 66   Ht 6' (1.829 m)   Wt 225 lb 3.2 oz (102.2 kg)   SpO2 97%   BMI 30.54 kg/m     Wt Readings from Last 3 Encounters:  09/28/20 225 lb 3.2 oz (102.2 kg)  05/26/20 220 lb 9.6 oz (100.1 kg)    GEN: Well nourished, well developed in no acute distress HEENT: Bilateral Frank's Sign NECK: No JVD; No carotid bruits LYMPHATICS: No lymphadenopathy CARDIAC: RRR, no murmurs, rubs, gallops RESPIRATORY:  Clear to auscultation without rales, wheezing or rhonchi  ABDOMEN: Soft, non-tender, non-distended MUSCULOSKELETAL:  No edema; No deformity  SKIN: Warm and dry NEUROLOGIC:  Alert and oriented x 3 PSYCHIATRIC:  Normal affect   ASSESSMENT:    1. Essential hypertension   2. Other hyperlipidemia   3. Aortic atherosclerosis (HCC)   4. Coronary artery calcification    PLAN:    In order of problems listed above:  Essential Hypertension - ambulatory blood pressure at goal, will continue ambulatory BP monitoring; gave education on how to perform ambulatory blood pressure monitoring including the frequency and technique; goal ambulatory blood pressure < 135/85 on average - continue home medications  - discussed diet (DASH/low sodium), and exercise/weight loss interventions   Hyperlipidemia Aortic Atherosclerosis Coronary Artery Calcification -LDL goal less than 70 - Bempedoic acid 180 mg PO daily - Fasting lipids and lfts at one year follow up - gave education on dietary changes - Reviewed CT scan with patient  One year follow up unless new symptoms or abnormal test results warranting change in plan  Would be reasonable for  Video Visit Follow up  Would be reasonable for  APP Follow up   Medication Adjustments/Labs and Tests Ordered: Current medicines are reviewed at length with the patient today.  Concerns regarding medicines are outlined above.  No orders of the defined types were placed in this encounter.  No orders of the defined types were placed in this  encounter.   Patient Instructions  Medication Instructions:  Your physician recommends that you continue on your current medications as directed. Please refer to the Current Medication list given to you today.  *If you need a refill on your cardiac medications before your next appointment, please call your pharmacy*   Lab Work: NONE If you have labs (blood work) drawn today and your tests are completely normal, you will receive your results only by: Marland Kitchen MyChart Message (if you have MyChart) OR . A paper copy in the mail If you have any lab test that is abnormal or we need to change your treatment, we will call you to review the results.   Testing/Procedures: 1 YEAR: Fasting lipid panel   Follow-Up: At [facility], you and your health needs are our priority.  As part of our continuing mission to provide you with exceptional heart care, we have created designated Provider Care Teams.  These Care Teams include  your primary Cardiologist (physician) and Advanced Practice Providers (APPs -  Physician Assistants and Nurse Practitioners) who all work together to provide you with the care you need, when you need it.    Your next appointment:   1 year(s)  The format for your next appointment:   In Person  Provider:   You may see Izora Ribas, MD or one of the following Advanced Practice Providers on your designated Care Team:    Ronie Spies, PA-C  Jacolyn Reedy, PA-C        Signed, Christell Constant, MD  09/28/2020 8:22 AM    [facility]

## 2020-09-28 NOTE — Patient Instructions (Signed)
Medication Instructions:  Your physician recommends that you continue on your current medications as directed. Please refer to the Current Medication list given to you today.  *If you need a refill on your cardiac medications before your next appointment, please call your pharmacy*   Lab Work: NONE If you have labs (blood work) drawn today and your tests are completely normal, you will receive your results only by: Marland Kitchen MyChart Message (if you have MyChart) OR . A paper copy in the mail If you have any lab test that is abnormal or we need to change your treatment, we will call you to review the results.   Testing/Procedures: 1 YEAR: Fasting lipid panel   Follow-Up: At [facility], you and your health needs are our priority.  As part of our continuing mission to provide you with exceptional heart care, we have created designated Provider Care Teams.  These Care Teams include your primary Cardiologist (physician) and Advanced Practice Providers (APPs -  Physician Assistants and Nurse Practitioners) who all work together to provide you with the care you need, when you need it.    Your next appointment:   1 year(s)  The format for your next appointment:   In Person  Provider:   You may see Izora Ribas, MD or one of the following Advanced Practice Providers on your designated Care Team:    Ronie Spies, PA-C  Jacolyn Reedy, PA-C

## 2020-09-28 NOTE — Addendum Note (Signed)
Addended by: Macie Burows on: 09/28/2020 09:25 AM   Modules accepted: Orders

## 2020-10-05 ENCOUNTER — Ambulatory Visit (HOSPITAL_COMMUNITY)
Admission: EM | Admit: 2020-10-05 | Discharge: 2020-10-05 | Disposition: A | Discharge status: Home / Self Care | Payer: BC Managed Care – PPO | Attending: Internal Medicine | Admitting: Internal Medicine

## 2020-10-05 ENCOUNTER — Other Ambulatory Visit: Payer: Self-pay

## 2020-10-05 ENCOUNTER — Ambulatory Visit (INDEPENDENT_AMBULATORY_CARE_PROVIDER_SITE_OTHER): Payer: BC Managed Care – PPO

## 2020-10-05 ENCOUNTER — Encounter (HOSPITAL_COMMUNITY): Payer: Self-pay

## 2020-10-05 DIAGNOSIS — R109 Unspecified abdominal pain: Secondary | ICD-10-CM | POA: Diagnosis not present

## 2020-10-05 DIAGNOSIS — R531 Weakness: Secondary | ICD-10-CM | POA: Diagnosis not present

## 2020-10-05 LAB — COMPREHENSIVE METABOLIC PANEL
ALT: 55 U/L — ABNORMAL HIGH (ref 0–44)
AST: 34 U/L (ref 15–41)
Albumin: 4.2 g/dL (ref 3.5–5.0)
Alkaline Phosphatase: 52 U/L (ref 38–126)
Anion gap: 9 (ref 5–15)
BUN: 13 mg/dL (ref 6–20)
CO2: 27 mmol/L (ref 22–32)
Calcium: 9.9 mg/dL (ref 8.9–10.3)
Chloride: 103 mmol/L (ref 98–111)
Creatinine, Ser: 1.11 mg/dL (ref 0.61–1.24)
GFR, Estimated: >60 mL/min (ref >60)
Glucose, Bld: 101 mg/dL — ABNORMAL HIGH (ref 70–99)
Potassium: 4.7 mmol/L (ref 3.5–5.1)
Sodium: 139 mmol/L (ref 135–145)
Total Bilirubin: 0.8 mg/dL (ref 0.3–1.2)
Total Protein: 7.3 g/dL (ref 6.5–8.1)

## 2020-10-05 LAB — CBC WITH DIFFERENTIAL/PLATELET
Abs Immature Granulocytes: 0.03 10*3/uL (ref 0.00–0.07)
Basophils Absolute: 0.1 10*3/uL (ref 0.0–0.1)
Basophils Relative: 1 %
Eosinophils Absolute: 0.3 10*3/uL (ref 0.0–0.5)
Eosinophils Relative: 3 %
HCT: 47.2 % (ref 39.0–52.0)
Hemoglobin: 15.4 g/dL (ref 13.0–17.0)
Immature Granulocytes: 0 %
Lymphocytes Relative: 30 %
Lymphs Abs: 2.4 10*3/uL (ref 0.7–4.0)
MCH: 30.7 pg (ref 26.0–34.0)
MCHC: 32.6 g/dL (ref 30.0–36.0)
MCV: 94.2 fL (ref 80.0–100.0)
Monocytes Absolute: 1.1 10*3/uL — ABNORMAL HIGH (ref 0.1–1.0)
Monocytes Relative: 13 %
Neutro Abs: 4.3 10*3/uL (ref 1.7–7.7)
Neutrophils Relative %: 53 %
Platelets: 218 10*3/uL (ref 150–400)
RBC: 5.01 MIL/uL (ref 4.22–5.81)
RDW: 12.8 % (ref 11.5–15.5)
WBC: 8.1 10*3/uL (ref 4.0–10.5)
nRBC: 0 % (ref 0.0–0.2)

## 2020-10-05 LAB — CK: Total CK: 198 U/L (ref 49–397)

## 2020-10-05 MED ORDER — DICYCLOMINE HCL 20 MG PO TABS: 20.0000 mg | ORAL_TABLET | Freq: Two times a day (BID) | ORAL | 0 refills | Status: DC | PRN

## 2020-10-05 MED ORDER — ONDANSETRON 4 MG PO TBDP: 4.0000 mg | ORAL_TABLET | Freq: Three times a day (TID) | ORAL | 0 refills | Status: DC | PRN

## 2020-10-05 MED ORDER — ONDANSETRON 4 MG PO TBDP
4.0000 mg | ORAL_TABLET | Freq: Once | ORAL | Status: AC
  Administered 2020-10-05: 4 mg via ORAL

## 2020-10-05 MED ORDER — ONDANSETRON 4 MG PO TBDP
ORAL_TABLET | ORAL | Status: AC
  Filled 2020-10-05: qty 1

## 2020-10-05 NOTE — ED Notes (Signed)
Patient escorted to outside of building by wheelchair.  Patient requested to be transferred to bench outside of building to wait for wife.

## 2020-10-05 NOTE — ED Triage Notes (Signed)
Pt presents with weakness x this morning around 0630. Pt states he has felt his left leg tighten up ands states he was nauseas. He denies chest pain and states he has been having stomach cramps since this morning. He states he has not been having normal bowel movements. He states he had diarrhea this morning. Pt states he has been having muscle spasms all over his body.

## 2020-10-05 NOTE — ED Provider Notes (Signed)
[facility]    CSN: [id number] Arrival date & time: 10/05/20  2595      History   Chief Complaint Chief Complaint  Patient presents with  . Weakness  . Spasms    HPI Eric Herrera is a 60 y.o. male with a history of hypertension comes to the urgent care with 1 day history of crampy abdominal pain which started about 30 a.m. this morning.  Patient has diarrhea which started about 3 days ago.  He took Pepto-Bismol which resulted in some improvement.  Over the weekend patient had a few bouts of nonbloody nonmucoid diarrhea.  No abdominal distention.  No known aggravating or relieving factors.  No recent travel.  No dietary change.  Patient ate a hamburger on Friday.  No fever or chills.  Patient endorses nausea but no vomiting.  No sick contacts.  Patient is fully vaccinated against COVID-19 virus and has received his booster.  Abdominal pain/cramps associated with intermittent generalized body cramps and weakness.  Patient Active Problem List   Diagnosis Date Noted  . Aortic atherosclerosis (HCC) 09/28/2020  . Coronary artery calcification 09/28/2020  . DOE (dyspnea on exertion) 05/26/2020  . Other hyperlipidemia 05/26/2020  . Essential hypertension 05/26/2020    History reviewed. No pertinent surgical history.     Home Medications    Prior to Admission medications   Medication Sig Start Date End Date Taking? Authorizing Provider  cetirizine (ZYRTEC) 10 MG tablet Take 1 tablet by mouth daily.   Yes [provider]  dicyclomine (BENTYL) 20 MG tablet Take 1 tablet (20 mg total) by mouth 2 (two) times daily as needed for spasms. 10/05/20  Yes Kandis Henry, Britta Mccreedy, MD  losartan (COZAAR) 25 MG tablet Take 1 tablet by mouth daily. 10/28/19  Yes [provider]  montelukast (SINGULAIR) 10 MG tablet Take 1 tablet by mouth daily. 01/12/17  Yes [provider]  ondansetron (ZOFRAN ODT) 4 MG disintegrating tablet Take 1 tablet (4 mg total) by mouth every 8  (eight) hours as needed for nausea or vomiting. 10/05/20  Yes Rosamond Andress, Britta Mccreedy, MD  albuterol (PROVENTIL) (2.5 MG/3ML) 0.083% nebulizer solution 3 ml as needed 12/18/18   [provider]  albuterol (VENTOLIN HFA) 108 (90 Base) MCG/ACT inhaler 1 puff as needed    [provider]  Bempedoic Acid (NEXLETOL) 180 MG TABS Take 1 tablet by mouth daily.  09/18/19   [provider]  diclofenac (VOLTAREN) 50 MG EC tablet 1 tablet with food 06/10/19   [provider]  pantoprazole (PROTONIX) 40 MG tablet Take 40 mg by mouth every other day. 04/19/20   [provider]  Respiratory Therapy Supplies (NEBULIZER) DEVI See admin instructions. 12/16/18   [provider]  RESTASIS 0.05 % ophthalmic emulsion 1 drop 2 (two) times daily. 08/10/20   [provider]    Family History Family History  Problem Relation Age of Onset  . Dementia Mother   . Hypotension Mother   . Emphysema Father   . COPD Father   . Hypertension Father   . Hypertension Brother     Social History Social History   Tobacco Use  . Smoking status: Never Smoker  . Smokeless tobacco: Never Used  Vaping Use  . Vaping Use: Never used  Substance Use Topics  . Alcohol use: Yes    Comment: occasional  . Drug use: Never     Allergies   Meperidine, Other, Cephalexin, Atorvastatin, Ezetimibe, Lisinopril, Prednisone, and Rosuvastatin  Review of Systems Review of Systems  HENT: Negative.   Respiratory: Negative.   Gastrointestinal: Positive for diarrhea and nausea. Negative for abdominal distention and vomiting.  Genitourinary: Negative.   Neurological: Negative.      Physical Exam Triage Vital Signs ED Triage Vitals  Enc Vitals Group     BP 10/05/20 0837 (!) 165/103     Pulse Rate 10/05/20 0837 73     Resp 10/05/20 0837 16     Temp 10/05/20 0837 97.9 F (36.6 C)     Temp Source 10/05/20 0837 Oral     SpO2 10/05/20 0837 97 %     Weight --      Height --       Head Circumference --      Peak Flow --      Pain Score 10/05/20 0835 0     Pain Loc --      Pain Edu? --      Excl. in GC? --    No data found.  Updated Vital Signs BP (!) 165/103 (BP Location: Right Arm)   Pulse 73   Temp 97.9 F (36.6 C) (Oral)   Resp 16   SpO2 97%   Visual Acuity Right Eye Distance:   Left Eye Distance:   Bilateral Distance:    Right Eye Near:   Left Eye Near:    Bilateral Near:     Physical Exam   UC Treatments / Results  Labs (all labs ordered are listed, but only abnormal results are displayed) Labs Reviewed  CBC WITH DIFFERENTIAL/PLATELET - Abnormal; Notable for the following components:      Result Value   Monocytes Absolute 1.1 (*)    All other components within normal limits  COMPREHENSIVE METABOLIC PANEL - Abnormal; Notable for the following components:   Glucose, Bld 101 (*)    ALT 55 (*)    All other components within normal limits  CK    EKG   Radiology DG Abd 1 View  Result Date: 10/05/2020 CLINICAL DATA:  Abdominal pain and weakness EXAM: ABDOMEN - 1 VIEW COMPARISON:  None. FINDINGS: Scattered large and small bowel gas is noted. No abnormal mass or abnormal calcifications are noted. No acute bony abnormality is seen. IMPRESSION: No acute abnormality noted Electronically Signed   By: Alcide Clever M.D.   On: 10/05/2020 09:27    Procedures Procedures (including critical care time)  Medications Ordered in UC Medications  ondansetron (ZOFRAN-ODT) disintegrating tablet 4 mg (4 mg Oral Given 10/05/20 0903)    Initial Impression / Assessment and Plan / UC Course  I have reviewed the triage vital signs and the nursing notes.  Pertinent labs & imaging results that were available during my care of the patient were reviewed by me and considered in my medical decision making (see chart for details).     1.  Acute gastroenteritis possibly food poisoning: Zofran as needed for nausea/vomiting Increase oral fluid intake KUB is  negative for intra-abdominal process CBC, CMP and creatinine kinase Return precautions given Bentyl as needed for abdominal cramps. Return precautions given. Final Clinical Impressions(s) / UC Diagnoses   Final diagnoses:  Abdominal cramps     Discharge Instructions     Increase oral fluid intake Take medications as directed We will call you if your labs are abnormal Return to urgent care if symptoms worsen.    ED Prescriptions    Medication Sig Dispense Auth. Provider   dicyclomine (BENTYL) 20 MG tablet Take 1  tablet (20 mg total) by mouth 2 (two) times daily as needed for spasms. 20 tablet Mong Neal, Britta Mccreedy, MD   ondansetron (ZOFRAN ODT) 4 MG disintegrating tablet Take 1 tablet (4 mg total) by mouth every 8 (eight) hours as needed for nausea or vomiting. 20 tablet Christabelle Hanzlik, Britta Mccreedy, MD     PDMP not reviewed this encounter.   Merrilee Jansky, MD 10/05/20 1536

## 2020-10-05 NOTE — Discharge Instructions (Signed)
Increase oral fluid intake Take medications as directed We will call you if your labs are abnormal Return to urgent care if symptoms worsen.

## 2020-10-28 ENCOUNTER — Telehealth: Payer: Self-pay | Admitting: *Deleted

## 2020-10-28 DIAGNOSIS — Z006 Encounter for examination for normal comparison and control in clinical research program: Secondary | ICD-10-CM

## 2020-10-28 NOTE — Telephone Encounter (Signed)
I called patient for 90-day Identify Study phone call. Patient is doing well and is not having the same symptoms he had prior to Cardiac CT. I reminded patient I would call back in Nov.2022.

## 2020-12-07 DIAGNOSIS — J309 Allergic rhinitis, unspecified: Secondary | ICD-10-CM | POA: Diagnosis not present

## 2020-12-07 DIAGNOSIS — J45909 Unspecified asthma, uncomplicated: Secondary | ICD-10-CM | POA: Diagnosis not present

## 2021-02-17 DIAGNOSIS — D485 Neoplasm of uncertain behavior of skin: Secondary | ICD-10-CM | POA: Diagnosis not present

## 2021-02-17 DIAGNOSIS — D225 Melanocytic nevi of trunk: Secondary | ICD-10-CM | POA: Diagnosis not present

## 2021-02-17 DIAGNOSIS — L814 Other melanin hyperpigmentation: Secondary | ICD-10-CM | POA: Diagnosis not present

## 2021-02-17 DIAGNOSIS — L820 Inflamed seborrheic keratosis: Secondary | ICD-10-CM | POA: Diagnosis not present

## 2021-02-17 DIAGNOSIS — L738 Other specified follicular disorders: Secondary | ICD-10-CM | POA: Diagnosis not present

## 2021-05-25 DIAGNOSIS — Z125 Encounter for screening for malignant neoplasm of prostate: Secondary | ICD-10-CM | POA: Diagnosis not present

## 2021-05-25 DIAGNOSIS — I10 Essential (primary) hypertension: Secondary | ICD-10-CM | POA: Diagnosis not present

## 2021-05-25 DIAGNOSIS — Z23 Encounter for immunization: Secondary | ICD-10-CM | POA: Diagnosis not present

## 2021-05-25 DIAGNOSIS — Z0000 Encounter for general adult medical examination without abnormal findings: Secondary | ICD-10-CM | POA: Diagnosis not present

## 2021-05-25 DIAGNOSIS — E7800 Pure hypercholesterolemia, unspecified: Secondary | ICD-10-CM | POA: Diagnosis not present

## 2021-05-31 IMAGING — CR DG CHEST 2V
2 series · 2 of 2 positions shown · non-contrast
Comparison: 07/07/2014

CLINICAL DATA: Shortness of breath on exertion

EXAM:
CHEST - 2 VIEW

[w chest pa]
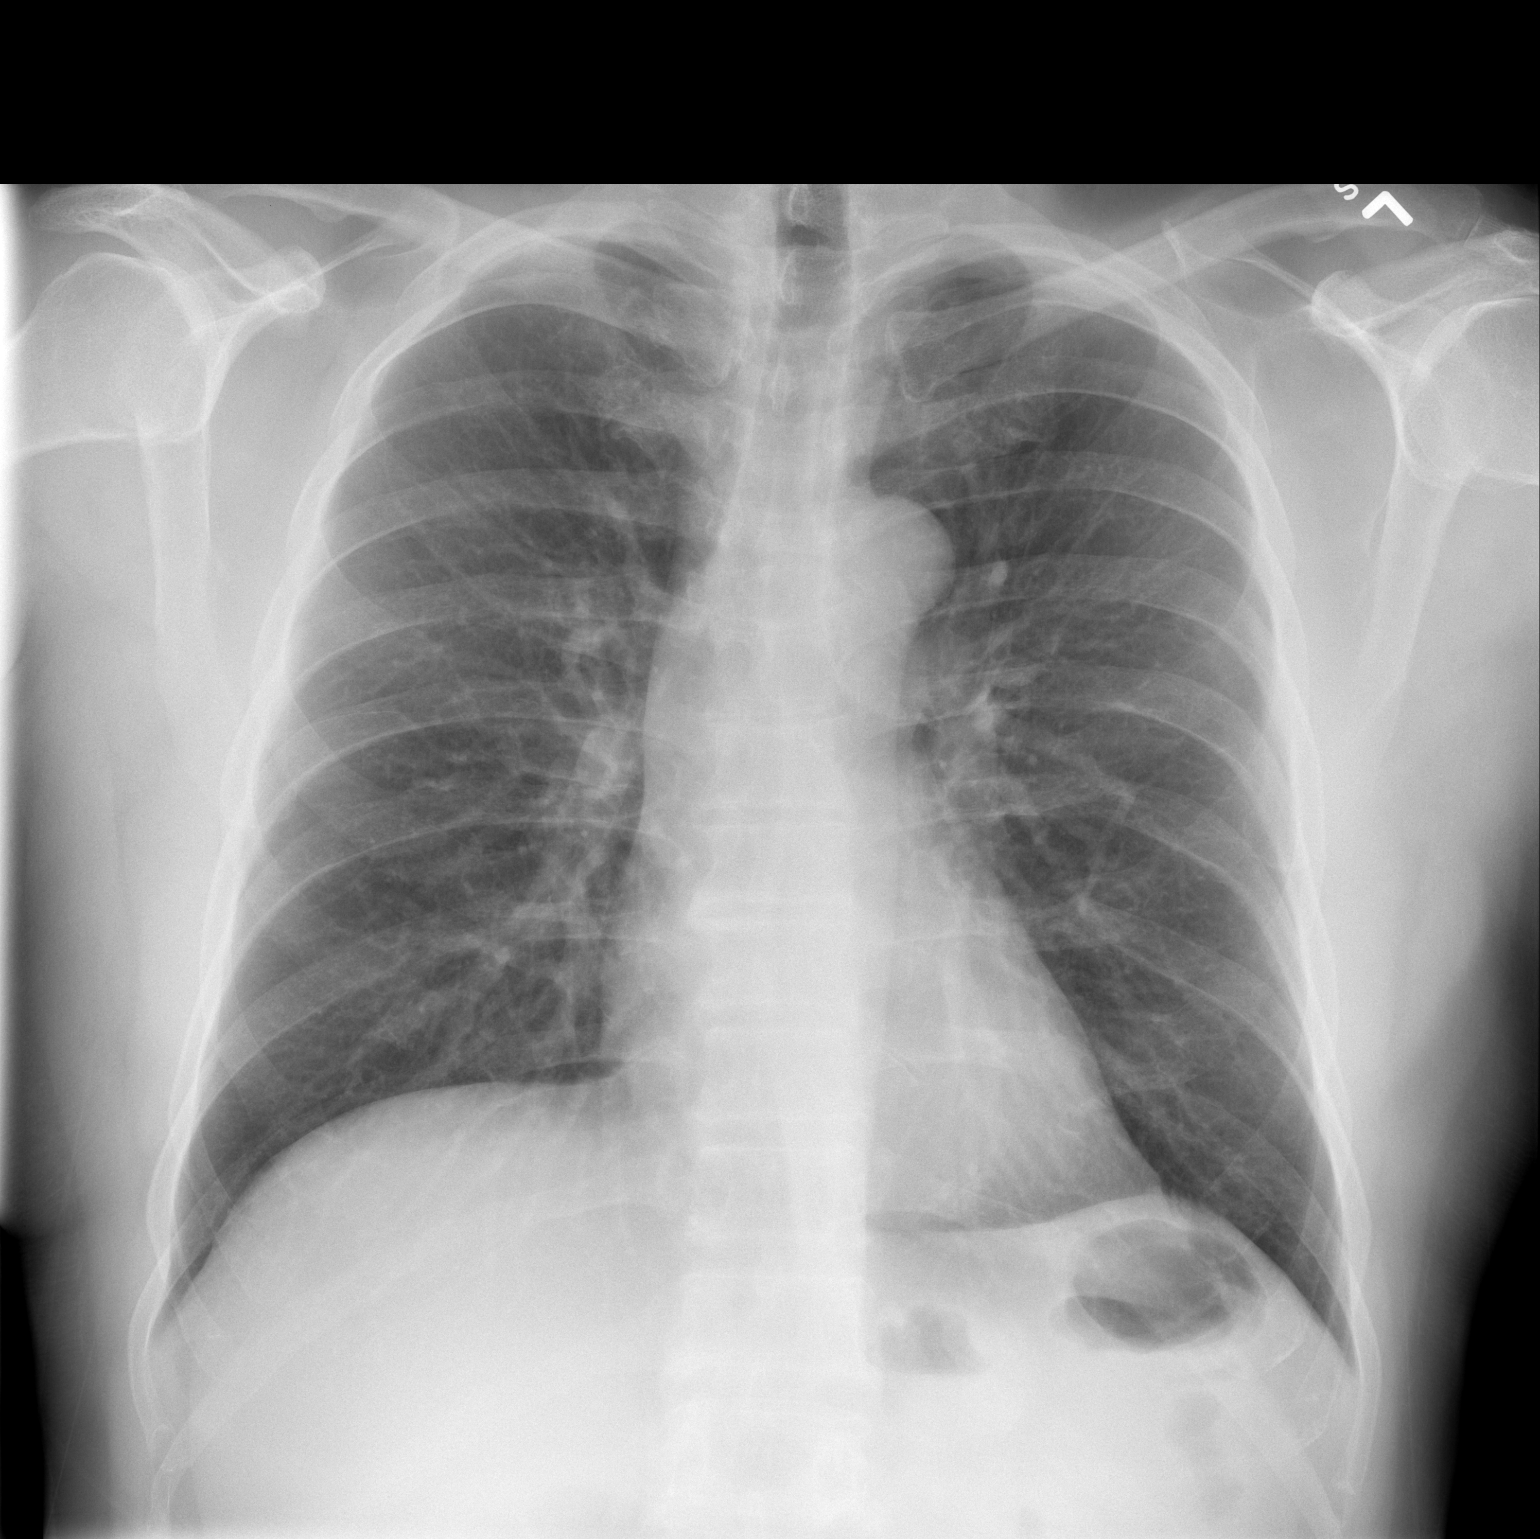

[w chest lat *]
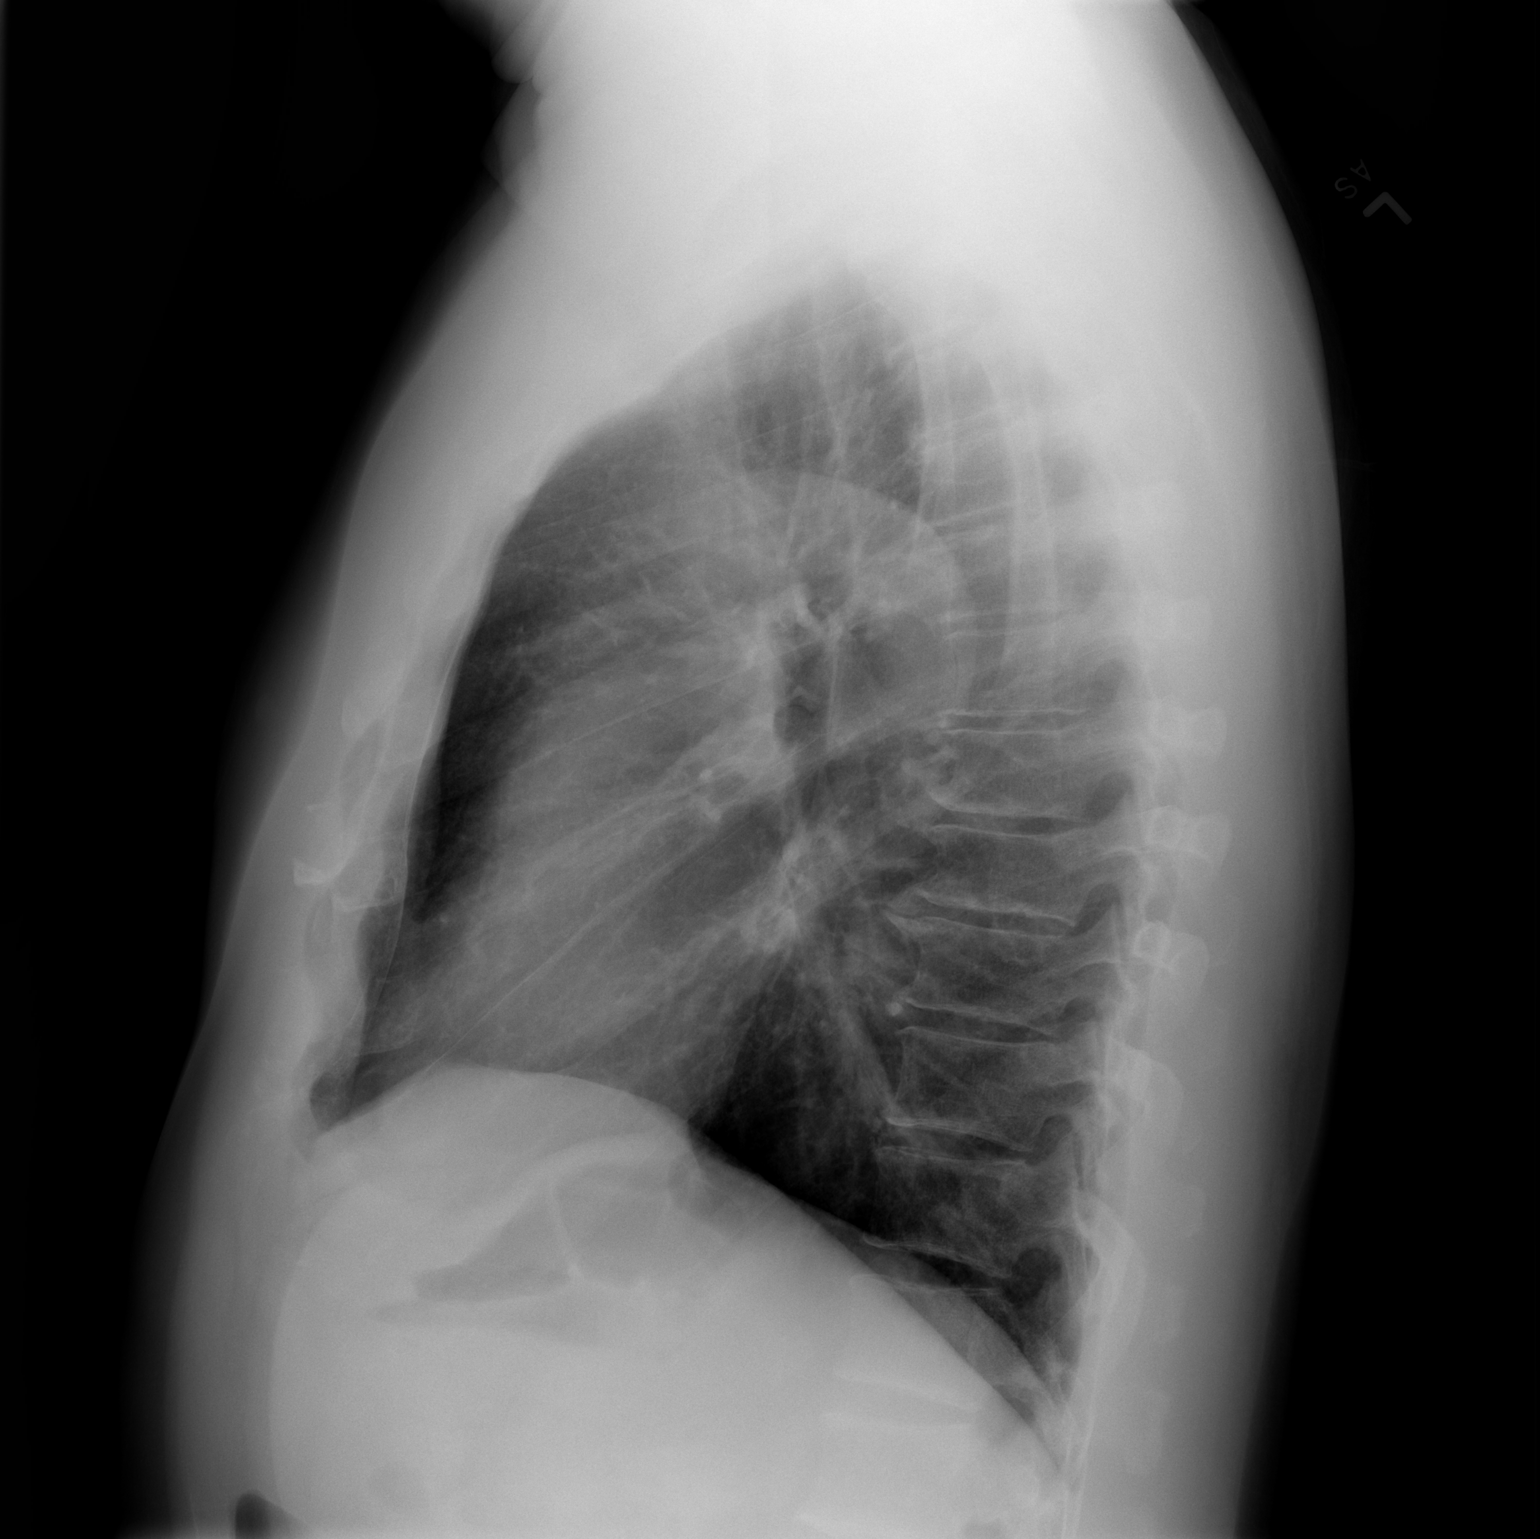

[2 of 2 positions shown; findings below may reference images not displayed]

FINDINGS: The heart size and mediastinal contours are within normal limits.
Both lungs are clear. The visualized skeletal structures are
unremarkable.
IMPRESSION: No active cardiopulmonary disease.

## 2021-06-15 DIAGNOSIS — R0981 Nasal congestion: Secondary | ICD-10-CM | POA: Diagnosis not present

## 2021-06-15 DIAGNOSIS — R0989 Other specified symptoms and signs involving the circulatory and respiratory systems: Secondary | ICD-10-CM | POA: Diagnosis not present

## 2021-06-15 DIAGNOSIS — R059 Cough, unspecified: Secondary | ICD-10-CM | POA: Diagnosis not present

## 2021-07-09 IMAGING — CT CT HEART MORP W/ CTA COR W/ SCORE W/ CA W/CM &/OR W/O CM
4 of 7 series · 8 of 20 positions shown, 9 images · IV contrast (APPLIED)
Comparison: 05/10/2020 chest radiograph.
COMPARISON: 05/10/2020 chest radiograph.

Addendum:
EXAM:
OVER-READ INTERPRETATION  CT CHEST

The following report is an over-read performed by radiologist Dr.
over-read does not include interpretation of cardiac or coronary
anatomy or pathology. The coronary CTA interpretation by the
cardiologist is attached.
HISTORY: 59 yo male with dyspnea on exertion (CEOLA)
Cardiac/Coronary CTA
TECHNIQUE: The patient was scanned on a Siemens Force scanner.
PROTOCOL: A 120 kV prospective scan was triggered in the descending thoracic
aorta at 111 HU's. Axial non-contrast 3 mm slices were carried out
through the heart. The data set was analyzed on a dedicated work
station and scored using the Agatson method. Gantry rotation speed
was 250 msecs and collimation was .6 mm. Beta blockade and 0.8 mg of
sl NTG was given. The 3D data set was reconstructed in 5% intervals
of the 67-82 % of the R-R cycle. Diastolic phases were analyzed on a
dedicated work station using MPR, MIP and VRT modes. The patient
received 80mL OMNIPAQUE IOHEXOL 350 MG/ML SOLN of contrast.

[Series 6: best diast 74 % · axial · 0.39mm/px · z∈[-254,-216]mm · 2 of 293 slices shown]
[im 98/293  vessel]
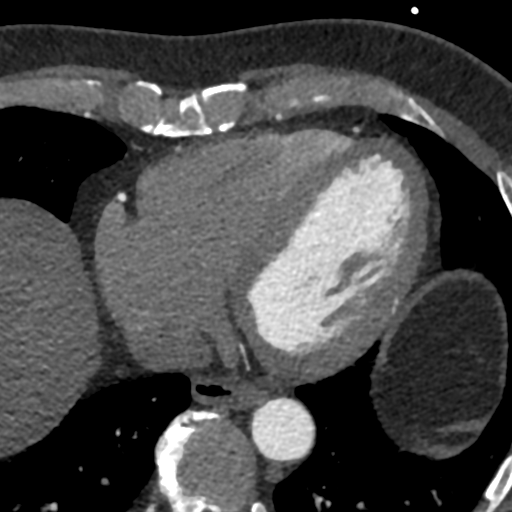
[im 195/293  vessel]
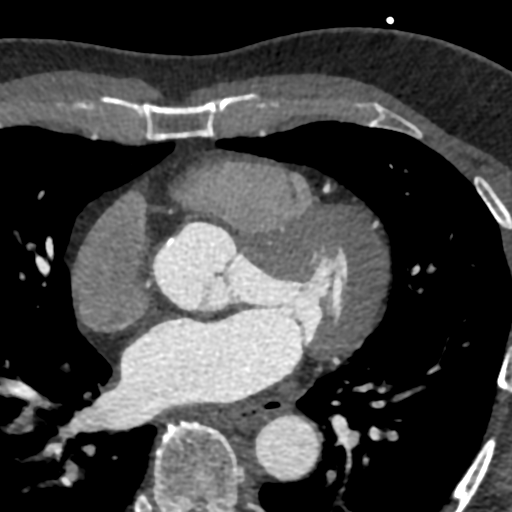

[Series 7: best syst · axial · 0.39mm/px · z∈[-254,-216]mm · 2 of 293 slices shown, 3 images]
[im 98/293  vessel]
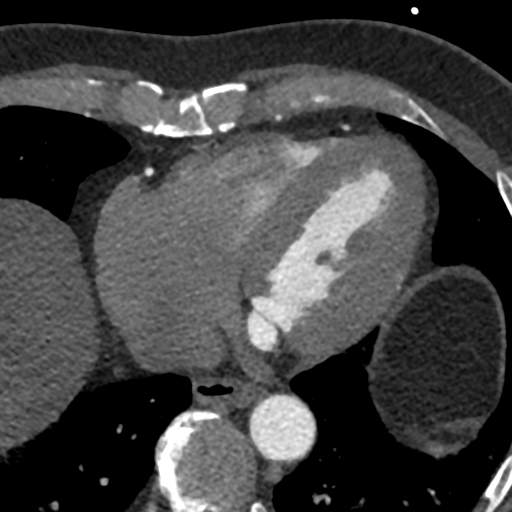
[im 98/293  lung]
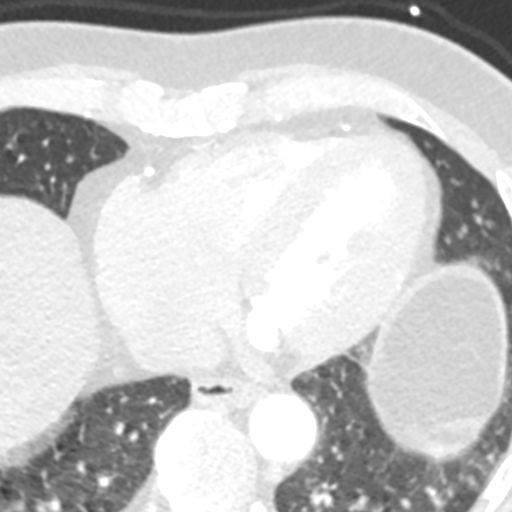
[im 195/293  vessel]
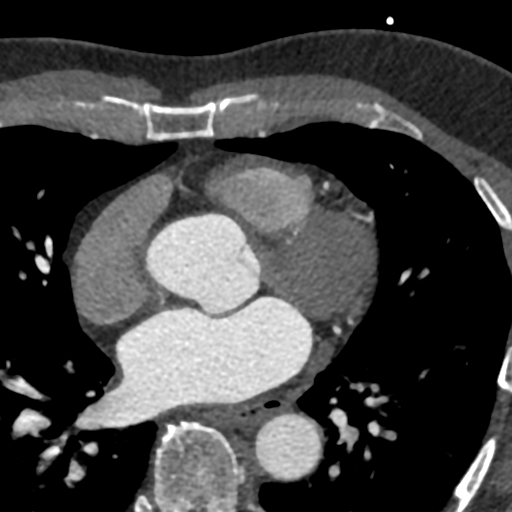

[Series 8: ts diast sharp 74 % · axial · 0.39mm/px · z∈[-254,-216]mm · 2 of 293 slices shown]
[im 98/293  lung]
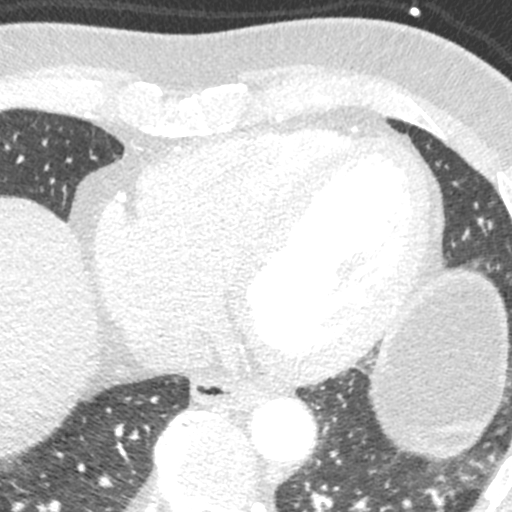
[im 195/293  lung]
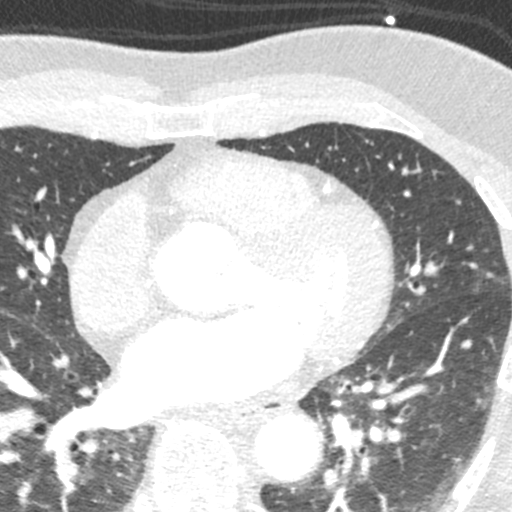

[Series 9: ts syst sharp · axial · 0.39mm/px · z∈[-254,-216]mm · 2 of 293 slices shown]
[im 98/293  lung]
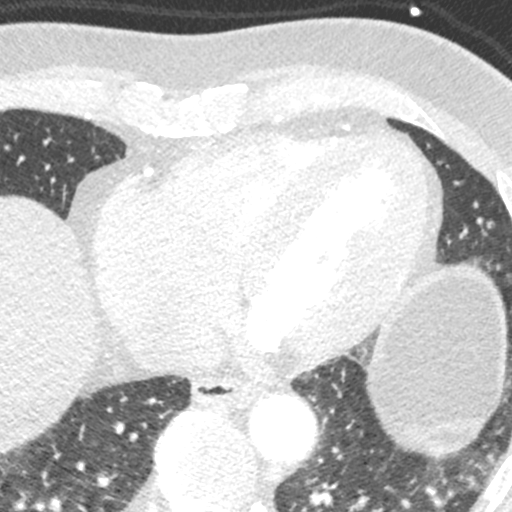
[im 195/293  lung]
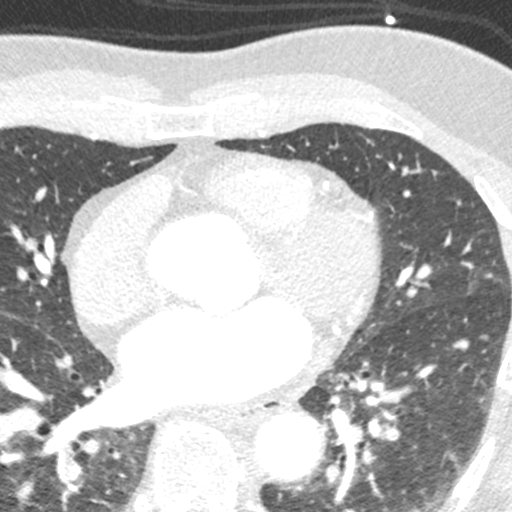

[8 of 20 positions shown; findings below may reference images not displayed]

FINDINGS: Vascular: Normal aortic caliber. No central pulmonary embolism, on
this non-dedicated study.

Mediastinum/Nodes: No imaged thoracic adenopathy.

Lungs/Pleura: No pleural fluid.  Clear imaged lungs.

Upper Abdomen: Normal imaged portions of the liver, spleen, stomach.

Musculoskeletal: No acute osseous abnormality.
IMPRESSION: No acute findings in the imaged extracardiac chest.
FINDINGS: Quality: Excellent, HR 52

Coronary calcium score: The patient's coronary artery calcium score
is 18, which places the patient in the 53rd percentile.

Coronary arteries: Normal coronary origins.  Right dominance.

Right Coronary Artery: Dominant. Minimal proximal 1-24%
non-calcified stenosis. Give off small R-PDA and R-PLB branches
without disease.

Left Main Coronary Artery: Minimal mixed 1-24% distal stenosis.
Bifurcates into the LAD and LCx arteries.

Left Anterior Descending Coronary Artery: Coarses anteriorly to the
apex. Minimal mixed 1-24% proximal stenosis (LRGIRG2O). Large D1
branch with mild 25-49% proximal mixed stenosis ([id number]).

Left Circumflex Artery: Moderate sized vessel that extends over the
lateral wall, gives off a smaller OM1 branch. Minimal mixed 1-24%
proximal stenosis of the LCx (LRGIRG2O).

Aorta: Normal size, 36 mm at the mid ascending aorta (level of the
PA bifurcation) measured double oblique. Aortic atherosclerosis. No
dissection.

Aortic Valve: Trileaflet.  No calcifications.

Other findings:

Normal pulmonary vein drainage into the left atrium.

Normal left atrial appendage without a thrombus.

Normal size of the pulmonary artery.
IMPRESSION: 1. Mild non-obstructive CAD, CADRADS = 2.

2. Coronary calcium score of 18. This was 53rd percentile for age
and sex matched control.

3. Normal coronary origin with right dominance.

4. Aortic atherosclerosis.

5. Further cardiac risk factor modification recommended.

*** End of Addendum ***
EXAM:
OVER-READ INTERPRETATION  CT CHEST

The following report is an over-read performed by radiologist Dr.
over-read does not include interpretation of cardiac or coronary
anatomy or pathology. The coronary CTA interpretation by the
cardiologist is attached.
FINDINGS: Vascular: Normal aortic caliber. No central pulmonary embolism, on
this non-dedicated study.

Mediastinum/Nodes: No imaged thoracic adenopathy.

Lungs/Pleura: No pleural fluid.  Clear imaged lungs.

Upper Abdomen: Normal imaged portions of the liver, spleen, stomach.

Musculoskeletal: No acute osseous abnormality.
IMPRESSION: No acute findings in the imaged extracardiac chest.

## 2021-07-18 DIAGNOSIS — H2513 Age-related nuclear cataract, bilateral: Secondary | ICD-10-CM | POA: Diagnosis not present

## 2021-07-18 DIAGNOSIS — H02831 Dermatochalasis of right upper eyelid: Secondary | ICD-10-CM | POA: Diagnosis not present

## 2021-07-18 DIAGNOSIS — H18593 Other hereditary corneal dystrophies, bilateral: Secondary | ICD-10-CM | POA: Diagnosis not present

## 2021-07-18 DIAGNOSIS — H40013 Open angle with borderline findings, low risk, bilateral: Secondary | ICD-10-CM | POA: Diagnosis not present

## 2021-09-23 DIAGNOSIS — R208 Other disturbances of skin sensation: Secondary | ICD-10-CM | POA: Diagnosis not present

## 2021-09-23 DIAGNOSIS — B078 Other viral warts: Secondary | ICD-10-CM | POA: Diagnosis not present

## 2021-09-23 DIAGNOSIS — R238 Other skin changes: Secondary | ICD-10-CM | POA: Diagnosis not present

## 2021-09-23 DIAGNOSIS — L280 Lichen simplex chronicus: Secondary | ICD-10-CM | POA: Diagnosis not present

## 2021-10-27 DIAGNOSIS — L821 Other seborrheic keratosis: Secondary | ICD-10-CM | POA: Diagnosis not present

## 2021-10-27 DIAGNOSIS — B078 Other viral warts: Secondary | ICD-10-CM | POA: Diagnosis not present

## 2021-10-27 DIAGNOSIS — L814 Other melanin hyperpigmentation: Secondary | ICD-10-CM | POA: Diagnosis not present

## 2021-10-27 DIAGNOSIS — D225 Melanocytic nevi of trunk: Secondary | ICD-10-CM | POA: Diagnosis not present

## 2022-02-16 ENCOUNTER — Ambulatory Visit
Admission: RE | Admit: 2022-02-16 | Discharge: 2022-02-16 | Disposition: A | Discharge status: Home / Self Care | Payer: BC Managed Care – PPO | Source: Ambulatory Visit | Attending: Physician Assistant | Admitting: Physician Assistant

## 2022-02-16 ENCOUNTER — Other Ambulatory Visit: Payer: Self-pay | Admitting: Physician Assistant

## 2022-02-16 DIAGNOSIS — S99921A Unspecified injury of right foot, initial encounter: Secondary | ICD-10-CM

## 2022-02-16 DIAGNOSIS — M79671 Pain in right foot: Secondary | ICD-10-CM | POA: Diagnosis not present

## 2022-02-16 DIAGNOSIS — S9031XA Contusion of right foot, initial encounter: Secondary | ICD-10-CM | POA: Diagnosis not present

## 2022-02-16 DIAGNOSIS — M7731 Calcaneal spur, right foot: Secondary | ICD-10-CM | POA: Diagnosis not present

## 2022-04-07 DIAGNOSIS — Z23 Encounter for immunization: Secondary | ICD-10-CM | POA: Diagnosis not present

## 2022-06-07 DIAGNOSIS — Z23 Encounter for immunization: Secondary | ICD-10-CM | POA: Diagnosis not present

## 2022-06-07 DIAGNOSIS — E7800 Pure hypercholesterolemia, unspecified: Secondary | ICD-10-CM | POA: Diagnosis not present

## 2022-06-07 DIAGNOSIS — J45909 Unspecified asthma, uncomplicated: Secondary | ICD-10-CM | POA: Diagnosis not present

## 2022-06-07 DIAGNOSIS — I10 Essential (primary) hypertension: Secondary | ICD-10-CM | POA: Diagnosis not present

## 2022-06-07 DIAGNOSIS — M79673 Pain in unspecified foot: Secondary | ICD-10-CM | POA: Diagnosis not present

## 2022-06-07 DIAGNOSIS — M2550 Pain in unspecified joint: Secondary | ICD-10-CM | POA: Diagnosis not present

## 2022-06-07 DIAGNOSIS — Z0000 Encounter for general adult medical examination without abnormal findings: Secondary | ICD-10-CM | POA: Diagnosis not present

## 2022-06-07 DIAGNOSIS — H409 Unspecified glaucoma: Secondary | ICD-10-CM | POA: Diagnosis not present

## 2022-06-08 DIAGNOSIS — M79672 Pain in left foot: Secondary | ICD-10-CM | POA: Diagnosis not present

## 2022-06-08 DIAGNOSIS — M79671 Pain in right foot: Secondary | ICD-10-CM | POA: Diagnosis not present

## 2022-06-27 DIAGNOSIS — M79672 Pain in left foot: Secondary | ICD-10-CM | POA: Diagnosis not present

## 2022-07-18 DIAGNOSIS — M79672 Pain in left foot: Secondary | ICD-10-CM | POA: Diagnosis not present

## 2022-07-19 DIAGNOSIS — H18593 Other hereditary corneal dystrophies, bilateral: Secondary | ICD-10-CM | POA: Diagnosis not present

## 2022-07-19 DIAGNOSIS — H401131 Primary open-angle glaucoma, bilateral, mild stage: Secondary | ICD-10-CM | POA: Diagnosis not present

## 2022-07-19 DIAGNOSIS — H02831 Dermatochalasis of right upper eyelid: Secondary | ICD-10-CM | POA: Diagnosis not present

## 2022-07-19 DIAGNOSIS — H2513 Age-related nuclear cataract, bilateral: Secondary | ICD-10-CM | POA: Diagnosis not present

## 2022-08-11 DIAGNOSIS — R5383 Other fatigue: Secondary | ICD-10-CM | POA: Diagnosis not present

## 2022-08-11 DIAGNOSIS — J069 Acute upper respiratory infection, unspecified: Secondary | ICD-10-CM | POA: Diagnosis not present

## 2022-08-11 DIAGNOSIS — R059 Cough, unspecified: Secondary | ICD-10-CM | POA: Diagnosis not present

## 2022-08-11 DIAGNOSIS — R0602 Shortness of breath: Secondary | ICD-10-CM | POA: Diagnosis not present

## 2022-08-16 DIAGNOSIS — H18593 Other hereditary corneal dystrophies, bilateral: Secondary | ICD-10-CM | POA: Diagnosis not present

## 2022-08-16 DIAGNOSIS — H0102A Squamous blepharitis right eye, upper and lower eyelids: Secondary | ICD-10-CM | POA: Diagnosis not present

## 2022-08-16 DIAGNOSIS — H0102B Squamous blepharitis left eye, upper and lower eyelids: Secondary | ICD-10-CM | POA: Diagnosis not present

## 2022-08-16 DIAGNOSIS — H401131 Primary open-angle glaucoma, bilateral, mild stage: Secondary | ICD-10-CM | POA: Diagnosis not present

## 2022-09-05 DIAGNOSIS — H401111 Primary open-angle glaucoma, right eye, mild stage: Secondary | ICD-10-CM | POA: Diagnosis not present

## 2022-09-19 DIAGNOSIS — H401121 Primary open-angle glaucoma, left eye, mild stage: Secondary | ICD-10-CM | POA: Diagnosis not present

## 2022-09-25 DIAGNOSIS — B029 Zoster without complications: Secondary | ICD-10-CM | POA: Diagnosis not present

## 2022-10-30 DIAGNOSIS — H18593 Other hereditary corneal dystrophies, bilateral: Secondary | ICD-10-CM | POA: Diagnosis not present

## 2022-10-30 DIAGNOSIS — H401131 Primary open-angle glaucoma, bilateral, mild stage: Secondary | ICD-10-CM | POA: Diagnosis not present

## 2022-10-30 DIAGNOSIS — H0102B Squamous blepharitis left eye, upper and lower eyelids: Secondary | ICD-10-CM | POA: Diagnosis not present

## 2022-10-30 DIAGNOSIS — H0102A Squamous blepharitis right eye, upper and lower eyelids: Secondary | ICD-10-CM | POA: Diagnosis not present

## 2022-12-08 DIAGNOSIS — E7801 Familial hypercholesterolemia: Secondary | ICD-10-CM | POA: Diagnosis not present

## 2022-12-08 DIAGNOSIS — I10 Essential (primary) hypertension: Secondary | ICD-10-CM | POA: Diagnosis not present

## 2023-05-18 DIAGNOSIS — K5730 Diverticulosis of large intestine without perforation or abscess without bleeding: Secondary | ICD-10-CM | POA: Diagnosis not present

## 2023-05-18 DIAGNOSIS — Z09 Encounter for follow-up examination after completed treatment for conditions other than malignant neoplasm: Secondary | ICD-10-CM | POA: Diagnosis not present

## 2023-05-18 DIAGNOSIS — Z860100 Personal history of colon polyps, unspecified: Secondary | ICD-10-CM | POA: Diagnosis not present

## 2023-05-18 DIAGNOSIS — K648 Other hemorrhoids: Secondary | ICD-10-CM | POA: Diagnosis not present

## 2023-06-08 DIAGNOSIS — D485 Neoplasm of uncertain behavior of skin: Secondary | ICD-10-CM | POA: Diagnosis not present

## 2023-06-08 DIAGNOSIS — L820 Inflamed seborrheic keratosis: Secondary | ICD-10-CM | POA: Diagnosis not present

## 2023-06-08 DIAGNOSIS — L501 Idiopathic urticaria: Secondary | ICD-10-CM | POA: Diagnosis not present

## 2023-06-08 DIAGNOSIS — R208 Other disturbances of skin sensation: Secondary | ICD-10-CM | POA: Diagnosis not present

## 2023-06-08 DIAGNOSIS — L821 Other seborrheic keratosis: Secondary | ICD-10-CM | POA: Diagnosis not present

## 2023-06-08 DIAGNOSIS — L538 Other specified erythematous conditions: Secondary | ICD-10-CM | POA: Diagnosis not present

## 2023-06-08 DIAGNOSIS — L503 Dermatographic urticaria: Secondary | ICD-10-CM | POA: Diagnosis not present

## 2023-06-13 DIAGNOSIS — J45909 Unspecified asthma, uncomplicated: Secondary | ICD-10-CM | POA: Diagnosis not present

## 2023-06-13 DIAGNOSIS — J309 Allergic rhinitis, unspecified: Secondary | ICD-10-CM | POA: Diagnosis not present

## 2023-06-13 DIAGNOSIS — E7801 Familial hypercholesterolemia: Secondary | ICD-10-CM | POA: Diagnosis not present

## 2023-06-13 DIAGNOSIS — Z0000 Encounter for general adult medical examination without abnormal findings: Secondary | ICD-10-CM | POA: Diagnosis not present

## 2023-06-13 DIAGNOSIS — Z125 Encounter for screening for malignant neoplasm of prostate: Secondary | ICD-10-CM | POA: Diagnosis not present

## 2023-06-13 DIAGNOSIS — Z23 Encounter for immunization: Secondary | ICD-10-CM | POA: Diagnosis not present

## 2023-06-13 DIAGNOSIS — I10 Essential (primary) hypertension: Secondary | ICD-10-CM | POA: Diagnosis not present

## 2023-06-13 DIAGNOSIS — E7800 Pure hypercholesterolemia, unspecified: Secondary | ICD-10-CM | POA: Diagnosis not present

## 2023-09-26 DIAGNOSIS — S00412A Abrasion of left ear, initial encounter: Secondary | ICD-10-CM | POA: Diagnosis not present

## 2023-12-11 ENCOUNTER — Ambulatory Visit (HOSPITAL_BASED_OUTPATIENT_CLINIC_OR_DEPARTMENT_OTHER): Payer: BC Managed Care – PPO | Admitting: Internal Medicine

## 2023-12-11 ENCOUNTER — Telehealth: Payer: Self-pay | Admitting: Pharmacy Technician

## 2023-12-11 ENCOUNTER — Other Ambulatory Visit (HOSPITAL_COMMUNITY): Payer: Self-pay

## 2023-12-11 VITALS — BP 140/98 | HR 65 | Ht 72.0 in | Wt 204.0 lb

## 2023-12-11 DIAGNOSIS — T466X5D Adverse effect of antihyperlipidemic and antiarteriosclerotic drugs, subsequent encounter: Secondary | ICD-10-CM

## 2023-12-11 DIAGNOSIS — E785 Hyperlipidemia, unspecified: Secondary | ICD-10-CM

## 2023-12-11 DIAGNOSIS — I2510 Atherosclerotic heart disease of native coronary artery without angina pectoris: Secondary | ICD-10-CM | POA: Diagnosis not present

## 2023-12-11 DIAGNOSIS — I700 Atherosclerosis of aorta: Secondary | ICD-10-CM | POA: Diagnosis not present

## 2023-12-11 DIAGNOSIS — M791 Myalgia, unspecified site: Secondary | ICD-10-CM

## 2023-12-11 MED ORDER — LOSARTAN POTASSIUM 25 MG PO TABS: 25.0000 mg | ORAL_TABLET | Freq: Every day | ORAL | 3 refills | Status: AC

## 2023-12-11 MED ORDER — REPATHA SURECLICK 140 MG/ML ~~LOC~~ SOAJ: 140.0000 mg | SUBCUTANEOUS | 3 refills | Status: DC

## 2023-12-11 NOTE — Progress Notes (Signed)
 LIPID CLINIC CONSULT NOTE  Chief Complaint:  Dyslipidemia  Primary Care Physician: Redmon, Noelle, PA  Primary Cardiologist:  Jann Melody, MD  HPI:  Eric Herrera is a 63 y.o. male who is being seen today for the evaluation of dyslipidemia at the request of Redmon, [place], [place].  Is a pleasant 63 year old male currently referred for evaluation management of dyslipidemia.  He does have a history of coronary artery disease.  He saw my partner Dr. [place] Dad back in 2021 ans 2022.  He underwent coronary CT angiography which I interpreted showed mild nonobstructive coronary disease and calcium score of 18, 53rd percentile for sex and age matched controls.  He did have some aortic atherosclerosis.  He had previously been on statin therapy but had significant body aches on atorvastatin and rosuvastatin.  He then was tried on ezetimibe which caused some nausea and vomiting.  Subsequently was placed on Nexletol which he said he tolerated fine but noted no significant improvement in his lipids.  Recent cholesterol testing which has been off of lipid-lowering therapy for more than 6 months show total cholesterol 218, triglycerides 162, HDL 35 and LDL 154.  In addition he has a history of hypertension and was previously on losartan but also stopped that medication more than 6 months ago.  Blood pressure today was 150/110 however rechecked manually by myself came down to 140/98.  PMHx:  No past medical history on file.  No past surgical history on file.  FAMHx:  Family History  Problem Relation Age of Onset   Dementia Mother    Hypotension Mother    Emphysema Father    COPD Father    Hypertension Father    Hypertension Brother     SOCHx:   reports that he has never smoked. He has never used smokeless tobacco. He reports current alcohol use. He reports that he does not use drugs.  ALLERGIES:  Allergies  Allergen Reactions   Meperidine Anaphylaxis    Demerol   Other  Anaphylaxis   Cephalexin Hives   Atorvastatin Other (See Comments)    Body aches   Ezetimibe Nausea And Vomiting   Lisinopril     Other reaction(s): Cough (ALLERGY/intolerance)   Prednisone Other (See Comments)    weakness   Rosuvastatin     Other reaction(s): Other (See Comments) Makes him weak    ROS: Pertinent items noted in HPI and remainder of comprehensive ROS otherwise negative.  HOME MEDS: Current Outpatient Medications on File Prior to Visit  Medication Sig Dispense Refill   albuterol  (PROVENTIL ) (2.5 MG/3ML) 0.083% nebulizer solution 3 ml as needed     albuterol  (VENTOLIN  HFA) 108 (90 Base) MCG/ACT inhaler 1 puff as needed     beclomethasone (QVAR REDIHALER) 80 MCG/ACT inhaler Inhale 1 puff into the lungs 2 (two) times daily.     cetirizine (ZYRTEC) 10 MG tablet Take 1 tablet by mouth daily.     diclofenac (VOLTAREN) 50 MG EC tablet Take 50 mg by mouth as needed.     EPINEPHrine 0.3 mg/0.3 mL IJ SOAJ injection Inject 0.3 mg into the muscle as needed.     montelukast (SINGULAIR) 10 MG tablet Take 1 tablet by mouth daily.     pantoprazole (PROTONIX) 40 MG tablet Take 40 mg by mouth daily as needed.     Respiratory Therapy Supplies (NEBULIZER) DEVI See admin instructions.     No current facility-administered medications on file prior to visit.    LABS/IMAGING: No results found  for this or any previous visit (from the past 48 hours). No results found.  LIPID PANEL: No results found for: "CHOL", "TRIG", "HDL", "CHOLHDL", "VLDL", "LDLCALC", "LDLDIRECT"  WEIGHTS: Wt Readings from Last 3 Encounters:  12/11/23 204 lb (92.5 kg)  09/28/20 225 lb 3.2 oz (102.2 kg)  05/26/20 220 lb 9.6 oz (100.1 kg)    VITALS: BP (!) 140/98   Pulse 65   Ht 6' (1.829 m)   Wt 204 lb (92.5 kg)   SpO2 99%   BMI 27.67 kg/m   EXAM: Deferred  EKG: Deferred  ASSESSMENT: Mixed dyslipidemia, goal LDL less than 70 Mild nonobstructive CAD by coronary CTA, CAC score of 18, 53rd  percentile (06/2020) Aortic atherosclerosis Statin and ezetimibe intolerance  PLAN: 1.   Eric Herrera mixed dyslipidemia remains well above a target LDL less than 70.  He could not tolerate statins which caused myalgias and ezetimibe which caused nausea vomiting.  He also tried Nexletol but says it was "not effective".  He is currently on no lipid-lowering therapies.  I think he is a good candidate for PCSK9 inhibitor to target LDL less than 70.  In addition we will need to assess for LP(a).  Also blood pressure is elevated today.  He has been off of his losartan for more than 6 months.  I provided a new prescription for losartan 25 mg daily and advised him to start to take it.  Will plan follow-up lipids in a few months.  Thanks again for the kind referral.  Hazle Lites, MD, [facility], FNLA, FACP    [facility]  Medical Director of the Advanced Lipid Disorders &  Cardiovascular Risk Reduction Clinic Diplomate of the American Board of Clinical Lipidology Attending Cardiologist  Direct Dial: [phone]  Fax: [phone]  Website:  [email or web address]  Eric Herrera 12/11/2023, 1:19 PM

## 2023-12-11 NOTE — Telephone Encounter (Signed)
 Update sent to patient via MyChart Rx(s) sent to pharmacy electronically.

## 2023-12-11 NOTE — Telephone Encounter (Signed)
 Pharmacy Patient Advocate Encounter   Received notification from Physician's Office that prior authorization for Repatha is required/requested.   Insurance verification completed.   The patient is insured through [facility] .   Per test claim: PA required; PA submitted to above mentioned insurance via CoverMyMeds Key/confirmation #/EOC [id number] Status is pending

## 2023-12-11 NOTE — Telephone Encounter (Signed)
-----   Message from Nurse Daria Eddy E sent at 12/11/2023  9:56 AM EDT ----- Regarding: repatha PA Hi Team!  This patient needs a PA for Repatha. LDL not at goal, statin and zetia intolerant   Thanks

## 2023-12-11 NOTE — Telephone Encounter (Signed)
 Pharmacy Patient Advocate Encounter  Received notification from [facility] that Prior Authorization for Repatha has been APPROVED from 12/11/23 to 12/10/24. Ran test claim, Copay is $45.00- one month. This test claim was processed through [facility] copay amounts may vary at other pharmacies due to pharmacy/plan contracts, or as the patient moves through the different stages of their insurance plan.   PA #/Case ID/Reference #: [id number]

## 2023-12-11 NOTE — Patient Instructions (Addendum)
 Medication Instructions:   START losartan 25mg  once daily for BP  Dr. Maximo Spar recommends Repatha Sureclick 140mg  (PCSK9). This is an injectable cholesterol medication self-administered once every 14 days. This medication will likely need prior approval with your insurance company, which we will work on. If the medication is not approved initially, we may need to do an appeal with your insurance.   Administer medication in area of fatty tissue such as abdomen, outer thigh, back of upper arm - and rotate site with each injection Store medication in refrigerator until ready to administer - allow to sit at room temp for 30 mins - 1 hour prior to injection Dispose of medication in a SHARPS container - your pharmacy should be able to direct you on this and proper disposal   If you need a co-pay card for Repatha: Lawsponsor.fr If you need a co-pay card for Praluent: [email or web address]  Patient Assistance:    These foundations have funds at various times.   The [employer]: https://www.panfoundation.org/disease-funds/hypercholesterolemia/ -- can sign up for wait list  The [facility] offers assistance to help pay for medication copays.  They will cover copays for all cholesterol lowering meds, including statins, fibrates, omega-3 fish oils like Vascepa, ezetimibe, Repatha, Praluent, Nexletol, Nexlizet.  The cards are usually good for $2,500 or 12 months, whichever comes first. Our fax # is [phone] (you will need this to apply) Go to healthwellfoundation.org Click on "Apply Now" Answer questions as to whom is applying (patient or representative) Your disease fund will be "hypercholesterolemia - Medicare access" They will ask questions about finances and which medications you are taking for cholesterol When you submit, the approval is usually within minutes.  You will need to print the card information from the site You will need to  show this information to your pharmacy, they will bill your Medicare Part D plan first -then bill Health Well --for the copay.   You can also call them at [phone], although the hold times can be quite long.     *If you need a refill on your cardiac medications before your next appointment, please call your pharmacy*  Lab Work: FASTING lab work in 3-4 months to check cholesterol   If you have labs (blood work) drawn today and your tests are completely normal, you will receive your results only by: MyChart Message (if you have MyChart) OR A paper copy in the mail If you have any lab test that is abnormal or we need to change your treatment, we will call you to review the results.  Follow-Up: At [facility], you and your health needs are our priority.  As part of our continuing mission to provide you with exceptional heart care, our providers are all part of one team.  This team includes your primary Cardiologist (physician) and Advanced Practice Providers or APPs (Physician Assistants and Nurse Practitioners) who all work together to provide you with the care you need, when you need it.  Your next appointment:    3-4 months with Slater Duncan, NP  We recommend signing up for the patient portal called "MyChart".  Sign up information is provided on this After Visit Summary.  MyChart is used to connect with patients for Virtual Visits (Telemedicine).  Patients are able to view lab/test results, encounter notes, upcoming appointments, etc.  Non-urgent messages can be sent to your provider as well.   To learn more about what you can do with MyChart, go to [email or web address].

## 2023-12-11 NOTE — Addendum Note (Signed)
 Addended by: Francesco Inks on: 12/11/2023 11:36 AM   Modules accepted: Orders

## 2023-12-12 NOTE — Telephone Encounter (Signed)
 Left message for patient that med is approved, Rx sent to [facility], call back w/questions or concerns.

## 2024-03-10 ENCOUNTER — Encounter (HOSPITAL_BASED_OUTPATIENT_CLINIC_OR_DEPARTMENT_OTHER): Payer: Self-pay | Admitting: *Deleted

## 2024-03-10 DIAGNOSIS — E7849 Other hyperlipidemia: Secondary | ICD-10-CM | POA: Diagnosis not present

## 2024-03-11 NOTE — Progress Notes (Signed)
 Cardiology Office Note   Date:  03/12/2024  ID:  Eric Herrera, Eric Herrera 10/15/1960, MRN [id number] PCP: Alvera Reagin, PA  [facility] Providers Cardiologist:  Stanly DELENA Leavens, MD     PMH Coronary calcium score of 18 (53rd percentile) Mild non-obstructive CAD Aortic atherosclerosis Dyslipidemia Hypertension Statin myalgia Ezetimibe intolerance  Referred to Advanced Lipid disorders clinic and seen by Dr. Mona on 12/11/2023.  Previously seen by Dr. Arnetha in 2021 and 2022 for DOE.  He underwent coronary CTA which revealed mild nonobstructive coronary disease, calcium score of 18 (53rd percentile), and aortic atherosclerosis.  He had previously been tried on statin therapy with atorvastatin and rosuvastatin but did not develop significant body aches.  He tried ezetimibe but this caused nausea and vomiting.  Subsequently was placed on Nexletol which he said he tolerated but did not have significant improvement in his lipids.  Recent cholesterol panel after being off lipid-lowering therapy for about 6 months showed total cholesterol 218, triglycerides 162, HDL 35, and LDL 154.  History of hypertension and was previously on losartan , but stopped the medication more than 6 months prior.  BP in clinic was 150/110 and then 140/98 on manual recheck.  He was advised to start PCSK9 inhibitor and was later approved for Repatha  140 mg every 14 days.  He was also advised to start losartan  25 mg once daily for elevated BP.  Plan to return in 3 to 4 months for lipid and LP(a) testing.   History of Present Illness Discussed the use of AI scribe software for clinical note transcription with the patient, who gave verbal consent to proceed.  History of Present Illness Eric Herrera is a pleasant 64 year old male who is here today for follow-up of dyslipidemia. History of calcium score of 18 and mild non-obstructive CAD on coronary CTA 06/2020. He experienced increased cholesterol levels after  stopping regular exercise.  His father has history of significant heart disease and had difficulty tolerating statins which caused caused rashes and muscle weakness.his father is also on Repatha .  Patient is tolerating Repatha  with only mild soreness at the injection site. He has taken six to eight doses but has been out of the medication for about 3 weeks.  He reports problems with getting a 39-month supply from [facility]. His blood pressure is typically in the 130s/80s, but he had a hypertensive episode with readings of 180/106, linked to work stress. He was prescribed losartan  but has not taken it consistently, opting for lifestyle changes. He notes an increase in blood pressure over the past four to five months.  He denies chest pain, shortness of breath, orthopnea, PND, edema, palpitations, presyncope, syncope.  ROS: See HPI  Studies Reviewed EKG Interpretation Date/Time:  Wednesday March 12 2024 09:14:02 EDT Ventricular Rate:  55 PR Interval:  150 QRS Duration:  92 QT Interval:  418 QTC Calculation: 399 R Axis:   26  Text Interpretation: Sinus bradycardia No acute changes Confirmed by Percy Browning [phone]) on 03/12/2024 9:34:54 AM     No results found for: LIPOA  Risk Assessment/Calculations   HYPERTENSION CONTROL Vitals:   03/12/24 0916 03/12/24 0956  BP: (!) 125/94 (!) 142/90    The patient's blood pressure is elevated above target today.  In order to address the patient's elevated BP: Blood pressure will be monitored at home to determine if medication changes need to be made.          Physical Exam VS:  BP (!) 142/90  Pulse (!) 55   Ht 6' (1.829 m)   Wt 204 lb 6.4 oz (92.7 kg)   SpO2 98%   BMI 27.72 kg/m    Wt Readings from Last 3 Encounters:  03/12/24 204 lb 6.4 oz (92.7 kg)  12/11/23 204 lb (92.5 kg)  09/28/20 225 lb 3.2 oz (102.2 kg)    GEN: Well nourished, well developed in no acute distress NECK: No JVD; No carotid bruits CARDIAC: RRR, no murmurs,  rubs, gallops RESPIRATORY:  Clear to auscultation without rales, wheezing or rhonchi  ABDOMEN: Soft, non-tender, non-distended EXTREMITIES:  No edema; No deformity   Assessment & Plan Aortic atherosclerosis Coronary artery disease Cardiac Risk Aortic atherosclerosis and mild, non obstructive CAD noted on CCTA 06/2020. He denies chest pain, dyspnea, or other symptoms concerning for angina.  No indication for further ischemic evaluation at this time. EKG today with no ST/T abnormality.  LDL goal 70 or lower; we are awaiting recent lipid testing results.  -Focus on secondary prevention including heart healthy mostly plant based diet avoiding saturated fat, processed foods, simple carbohydrates, and sugar along with aiming for at least 150 minutes of moderate intensity exercise each week - Continue Repatha    Hyperlipidemia LDL goal < 70 Statin intolerance   He completed lab testing earlier this week.  LP(a) is not elevated.  We are awaiting results of NMR lipid profile. Prior lipid panel 06/13/2023 with LDL 143.  He has been compliant with Repatha  since that time, however he has gone 3 weeks without the medication due to a pharmacy error.  -Continue Repatha  140 mg every 14 days -We will provide Repatha  sample for him today - Recommend changing to [facility] pharmacy for better compliance - Continue heart healthy diet along with regular physical activity  Hypertension   BP is elevated in clinic today.  He admits to home readings often in the high 130s/80s and occasional spikes to 180s/100s.  He feels BP elevation is primarily secondary to stress.  He does not take losartan  daily. He prefers to manage with lifestyle -Advised that he start losartan  25 mg daily or find another suitable medication -Monitor blood pressure at home regularly for goal < 130/80  Sinus bradycardia   EKG today reveals sinus bradycardia at 55 bpm.  This is consistent with past EKG.  He is asymptomatic  - No further  testing/intervention indicated         Dispo: 1 year with Dr. Mona or me  Signed, Rosaline Bane, NP-C

## 2024-03-12 ENCOUNTER — Other Ambulatory Visit (HOSPITAL_BASED_OUTPATIENT_CLINIC_OR_DEPARTMENT_OTHER): Payer: Self-pay

## 2024-03-12 ENCOUNTER — Encounter (HOSPITAL_BASED_OUTPATIENT_CLINIC_OR_DEPARTMENT_OTHER): Payer: Self-pay | Admitting: Nurse Practitioner

## 2024-03-12 ENCOUNTER — Ambulatory Visit (HOSPITAL_BASED_OUTPATIENT_CLINIC_OR_DEPARTMENT_OTHER): Admitting: Nurse Practitioner

## 2024-03-12 VITALS — BP 142/90 | HR 55 | Ht 72.0 in | Wt 204.4 lb

## 2024-03-12 DIAGNOSIS — E785 Hyperlipidemia, unspecified: Secondary | ICD-10-CM

## 2024-03-12 DIAGNOSIS — R001 Bradycardia, unspecified: Secondary | ICD-10-CM | POA: Diagnosis not present

## 2024-03-12 DIAGNOSIS — I2510 Atherosclerotic heart disease of native coronary artery without angina pectoris: Secondary | ICD-10-CM

## 2024-03-12 DIAGNOSIS — M791 Myalgia, unspecified site: Secondary | ICD-10-CM

## 2024-03-12 DIAGNOSIS — I10 Essential (primary) hypertension: Secondary | ICD-10-CM

## 2024-03-12 DIAGNOSIS — T466X5D Adverse effect of antihyperlipidemic and antiarteriosclerotic drugs, subsequent encounter: Secondary | ICD-10-CM

## 2024-03-12 DIAGNOSIS — Z7189 Other specified counseling: Secondary | ICD-10-CM

## 2024-03-12 DIAGNOSIS — I700 Atherosclerosis of aorta: Secondary | ICD-10-CM | POA: Diagnosis not present

## 2024-03-12 LAB — NMR, LIPOPROFILE
Cholesterol, Total: 111 mg/dL (ref 100–199)
HDL Particle Number: 33.2 umol/L (ref >=30.5)
HDL-C: 46 mg/dL (ref >39)
LDL Particle Number: 507 nmol/L (ref <1000)
LDL Size: 20.1 nm — ABNORMAL LOW (ref >20.5)
LDL-C (NIH Calc): 45 mg/dL (ref 0–99)
LP-IR Score: 59 — ABNORMAL HIGH (ref <=45)
Small LDL Particle Number: 384 nmol/L (ref <=527)
Triglycerides: 109 mg/dL (ref 0–149)

## 2024-03-12 LAB — LIPOPROTEIN A (LPA): Lipoprotein (a): <8.4 nmol/L (ref <75.0)

## 2024-03-12 MED ORDER — REPATHA SURECLICK 140 MG/ML ~~LOC~~ SOAJ
140.0000 mg | SUBCUTANEOUS | 3 refills | Status: AC
  Filled 2024-03-12: qty 6, 84d supply, fill #0
  Filled 2024-06-05: qty 6, 84d supply, fill #1
  Filled 2024-09-03: qty 6, 84d supply, fill #2

## 2024-03-12 MED ORDER — REPATHA SURECLICK 140 MG/ML ~~LOC~~ SOAJ: 140.0000 mg | SUBCUTANEOUS | 0 refills | Status: AC

## 2024-03-12 NOTE — Patient Instructions (Signed)
 Medication Instructions:   Your physician recommends that you continue on your current medications as directed. Please refer to the Current Medication list given to you today.   *If you need a refill on your cardiac medications before your next appointment, please call your pharmacy*  Lab Work:  None ordered.  If you have labs (blood work) drawn today and your tests are completely normal, you will receive your results only by: MyChart Message (if you have MyChart) OR A paper copy in the mail If you have any lab test that is abnormal or we need to change your treatment, we will call you to review the results.  Testing/Procedures:  None ordered.  Follow-Up: At [facility], you and your health needs are our priority.  As part of our continuing mission to provide you with exceptional heart care, our providers are all part of one team.  This team includes your primary Cardiologist (physician) and Advanced Practice Providers or APPs (Physician Assistants and Nurse Practitioners) who all work together to provide you with the care you need, when you need it.  Your next appointment:   1 year(s)  Provider:   Rosaline Bane, NP    We recommend signing up for the patient portal called MyChart.  Sign up information is provided on this After Visit Summary.  MyChart is used to connect with patients for Virtual Visits (Telemedicine).  Patients are able to view lab/test results, encounter notes, upcoming appointments, etc.  Non-urgent messages can be sent to your provider as well.   To learn more about what you can do with MyChart, go to [email or web address].   Other Instructions  Your physician wants you to follow-up in: 1 year.  You will receive a reminder letter in the mail two months in advance. If you don't receive a letter, please call our office to schedule the follow-up appointment.  HOW TO TAKE YOUR BLOOD PRESSURE  Rest 5 minutes before taking your blood  pressure. Don't  smoke or drink caffeinated beverages for at least 30 minutes before. Take your blood pressure before (not after) you eat. Sit comfortably with your back supported and both feet on the floor ( don't cross your legs). Elevate your arm to heart level on a table or a desk. Use the proper sized cuff.  It should fit smoothly and snugly around your bare upper arm.  There should be  Enough room to slip a fingertip under the cuff.  The bottom edge of the cuff should be 1 inch above the crease Of the elbow. Please monitor your blood pressure once daily 2 hours after your am medication. Your goal blood pressure is 130 (systolic) top number or over 80 ( diastolic) bottom number or below.     ----Avoid cold medicines with D or DM at the end of them----

## 2024-03-13 ENCOUNTER — Ambulatory Visit: Payer: Self-pay | Admitting: Internal Medicine

## 2024-05-22 DIAGNOSIS — J45901 Unspecified asthma with (acute) exacerbation: Secondary | ICD-10-CM | POA: Diagnosis not present

## 2024-05-22 DIAGNOSIS — R051 Acute cough: Secondary | ICD-10-CM | POA: Diagnosis not present

## 2024-05-22 DIAGNOSIS — R062 Wheezing: Secondary | ICD-10-CM | POA: Diagnosis not present

## 2024-05-24 DIAGNOSIS — J988 Other specified respiratory disorders: Secondary | ICD-10-CM | POA: Diagnosis not present

## 2024-05-24 DIAGNOSIS — H109 Unspecified conjunctivitis: Secondary | ICD-10-CM | POA: Diagnosis not present

## 2024-05-24 DIAGNOSIS — J4541 Moderate persistent asthma with (acute) exacerbation: Secondary | ICD-10-CM | POA: Diagnosis not present

## 2024-06-05 ENCOUNTER — Other Ambulatory Visit (HOSPITAL_BASED_OUTPATIENT_CLINIC_OR_DEPARTMENT_OTHER): Payer: Self-pay

## 2024-06-16 DIAGNOSIS — Z125 Encounter for screening for malignant neoplasm of prostate: Secondary | ICD-10-CM | POA: Diagnosis not present

## 2024-06-16 DIAGNOSIS — K219 Gastro-esophageal reflux disease without esophagitis: Secondary | ICD-10-CM | POA: Diagnosis not present

## 2024-06-16 DIAGNOSIS — Z23 Encounter for immunization: Secondary | ICD-10-CM | POA: Diagnosis not present

## 2024-06-16 DIAGNOSIS — Z0000 Encounter for general adult medical examination without abnormal findings: Secondary | ICD-10-CM | POA: Diagnosis not present

## 2024-06-16 DIAGNOSIS — I10 Essential (primary) hypertension: Secondary | ICD-10-CM | POA: Diagnosis not present

## 2024-06-16 DIAGNOSIS — J45909 Unspecified asthma, uncomplicated: Secondary | ICD-10-CM | POA: Diagnosis not present

## 2024-06-16 DIAGNOSIS — J309 Allergic rhinitis, unspecified: Secondary | ICD-10-CM | POA: Diagnosis not present

## 2024-09-03 ENCOUNTER — Other Ambulatory Visit (HOSPITAL_BASED_OUTPATIENT_CLINIC_OR_DEPARTMENT_OTHER): Payer: Self-pay

## 2024-09-05 ENCOUNTER — Other Ambulatory Visit (HOSPITAL_BASED_OUTPATIENT_CLINIC_OR_DEPARTMENT_OTHER): Payer: Self-pay
